# Patient Record
Sex: Female | Born: 1985 | Race: White | Hispanic: No | Marital: Single | Smoking: Former smoker
Health system: Southern US, Community
[De-identification: ages and names within clinical notes are randomized; demographics above are authoritative.]

## PROBLEM LIST (undated history)

## (undated) DIAGNOSIS — K08409 Partial loss of teeth, unspecified cause, unspecified class: Secondary | ICD-10-CM

## (undated) DIAGNOSIS — K8010 Calculus of gallbladder with chronic cholecystitis without obstruction: Secondary | ICD-10-CM

## (undated) DIAGNOSIS — F419 Anxiety disorder, unspecified: Secondary | ICD-10-CM

## (undated) HISTORY — DX: Partial loss of teeth, unspecified cause, unspecified class: K08.409

## (undated) HISTORY — DX: Calculus of gallbladder with chronic cholecystitis without obstruction: K80.10

---
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
## 2009-08-07 ENCOUNTER — Emergency Department: Payer: Self-pay | Admitting: Emergency Medicine

## 2012-07-15 DIAGNOSIS — K8010 Calculus of gallbladder with chronic cholecystitis without obstruction: Secondary | ICD-10-CM

## 2012-07-15 HISTORY — DX: Calculus of gallbladder with chronic cholecystitis without obstruction: K80.10

## 2012-09-02 ENCOUNTER — Ambulatory Visit: Payer: Self-pay | Admitting: Family Medicine

## 2012-09-13 ENCOUNTER — Emergency Department: Payer: Self-pay | Admitting: Emergency Medicine

## 2012-09-30 ENCOUNTER — Ambulatory Visit: Payer: Self-pay | Admitting: General Surgery

## 2012-09-30 DIAGNOSIS — K8010 Calculus of gallbladder with chronic cholecystitis without obstruction: Secondary | ICD-10-CM

## 2012-09-30 HISTORY — PX: CHOLECYSTECTOMY: SHX55

## 2012-09-30 LAB — CBC WITH DIFFERENTIAL/PLATELET
Basophil #: 0.1 10*3/uL (ref 0.0–0.1)
HGB: 14.8 g/dL (ref 12.0–16.0)
Lymphocyte %: 24.4 %
Monocyte #: 0.6 x10 3/mm (ref 0.2–0.9)
Monocyte %: 5.9 %
Neutrophil #: 6.9 10*3/uL — ABNORMAL HIGH (ref 1.4–6.5)
Platelet: 308 10*3/uL (ref 150–440)
WBC: 10.5 10*3/uL (ref 3.6–11.0)

## 2012-10-02 ENCOUNTER — Emergency Department: Payer: Self-pay | Admitting: Emergency Medicine

## 2012-10-02 LAB — PATHOLOGY REPORT

## 2012-10-16 ENCOUNTER — Encounter: Payer: Self-pay | Admitting: *Deleted

## 2012-10-21 ENCOUNTER — Encounter: Payer: Self-pay | Admitting: General Surgery

## 2012-10-21 ENCOUNTER — Encounter: Payer: BC Managed Care – PPO | Admitting: General Surgery

## 2012-10-21 ENCOUNTER — Ambulatory Visit (INDEPENDENT_AMBULATORY_CARE_PROVIDER_SITE_OTHER): Payer: BC Managed Care – PPO | Admitting: General Surgery

## 2012-10-21 VITALS — BP 120/80 | HR 80 | Resp 14 | Ht 67.0 in | Wt 225.0 lb

## 2012-10-21 DIAGNOSIS — K811 Chronic cholecystitis: Secondary | ICD-10-CM

## 2012-10-21 NOTE — Patient Instructions (Signed)
Call for concerns or questions 

## 2012-10-21 NOTE — Progress Notes (Signed)
Patient ID: Brenda Fowler, female   DOB: 1985/08/10, 27 y.o.   MRN: [id number]  Chief Complaint  Patient presents with  . Follow-up    postop    HPI Brenda Fowler is a 27 y.o. female.  Patient here today postoperative laparoscopy cholecystectomy on 09-30-12.  Requires no pain medications and has returned to work. HPI  Past Medical History  Diagnosis Date  . Calculus of gallbladder with other cholecystitis, without mention of obstruction 2014  . History of wisdom tooth extraction     Past Surgical History  Procedure Laterality Date  . Cholecystectomy  09/30/2012    No family history on file.  Social History History  Substance Use Topics  . Smoking status: Former Smoker    Quit date: 07/15/2010  . Smokeless tobacco: Never Used  . Alcohol Use: Yes    Allergies  Allergen Reactions  . Ceclor (Cefaclor) Rash    Current Outpatient Prescriptions  Medication Sig Dispense Refill  . etonogestrel (IMPLANON) 68 MG IMPL implant Inject 1 each into the skin once.       No current facility-administered medications for this visit.    Review of Systems Review of Systems  Constitutional: Negative.   Respiratory: Negative.   Cardiovascular: Negative.     Blood pressure 120/80, pulse 80, resp. rate 14, height 5\' 7"  (1.702 m), weight 225 lb (102.059 kg).  Physical Exam Physical Exam  Constitutional: She is oriented to person, place, and time. She appears well-developed and well-nourished.  Cardiovascular: Normal rate and regular rhythm.   Pulmonary/Chest: Effort normal and breath sounds normal.  Abdominal: Soft. Bowel sounds are normal.  Neurological: She is alert and oriented to person, place, and time.  Skin: Skin is warm and dry.  Lap sites healed  Data Reviewed Pathology showed chronic cholecystitis and cholelithiasis. No evidence of malignancy or dysplasia Assessment    The patient is doing well post cholecystectomy with complete relief of her symptoms    Plan    Followup  here will be on an as-needed basis.       Brenda Fowler 10/22/2012, 8:50 PM

## 2013-03-05 ENCOUNTER — Telehealth: Payer: Self-pay | Admitting: General Surgery

## 2013-03-05 DIAGNOSIS — K625 Hemorrhage of anus and rectum: Secondary | ICD-10-CM

## 2013-03-05 MED ORDER — HYDROCORTISONE ACETATE 25 MG RE SUPP: 25.0000 mg | Freq: Two times a day (BID) | RECTAL | Status: DC

## 2013-03-05 NOTE — Telephone Encounter (Signed)
Patient called reporting single episode of painless BRBPR w/ BM this afternoon. Reports 4-5 BM/ day since lap chole in March 2014 (previously 1 BM q 2-3 d). Usually, experiences abdominal discomfort prior to [facility] w/ complete relief after BM. No anal pain reported. Otherwise, feeling well. Suspect anorectal source, and as she reports no pain, likely internal hemorrhoids. To use Anusol HC supp BID over the weekend, and to call on Monday, August 25 to arrange f/u appt to assess options for management of loose stools.

## 2014-05-16 ENCOUNTER — Encounter: Payer: Self-pay | Admitting: General Surgery

## 2014-10-12 IMAGING — US US ABDOMEN LIMITED SLG ORGAN/ASCITES
1 series · 14 of 25 positions shown · non-contrast
Comparison: none

REASON FOR EXAM: Abd Pain Eval Gallbladder
COMMENTS:

PROCEDURE:     LSH - LSH ABDOMEN LTD 1 ORGAN OR QUAD  - September 02, 2012 [DATE]
RESULT:     Comparison: None
TECHNIQUE: Multiple gray-scale and color-flow Doppler images of the right
upper quadrant are presented for review.

[Series 1: us abdomen limited slg organ/ascites · 0.31mm/px · 14 of 46 slices shown]
[im 1/46]
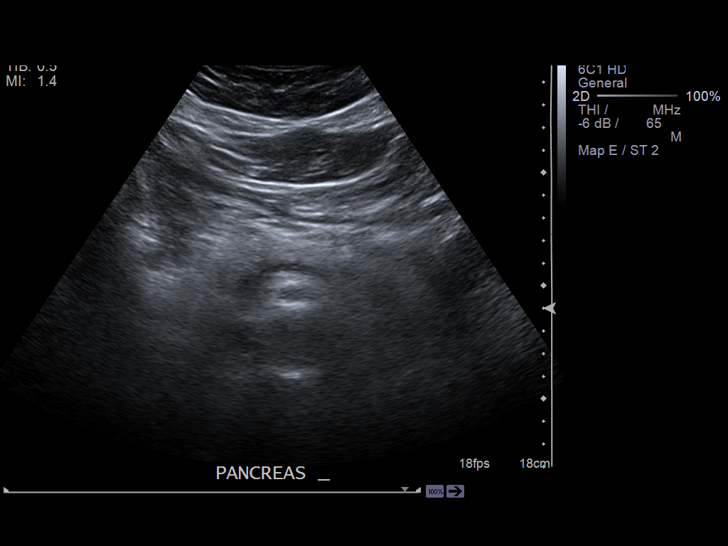
[im 4/46]
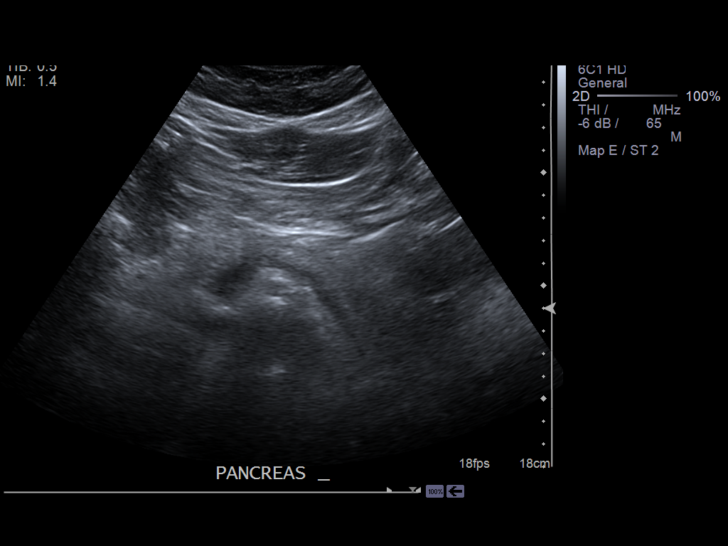
[im 8/46]
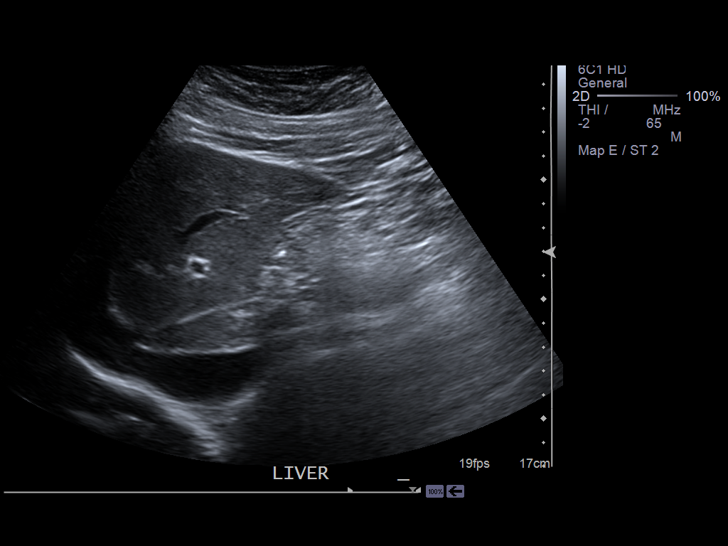
[im 12/46]
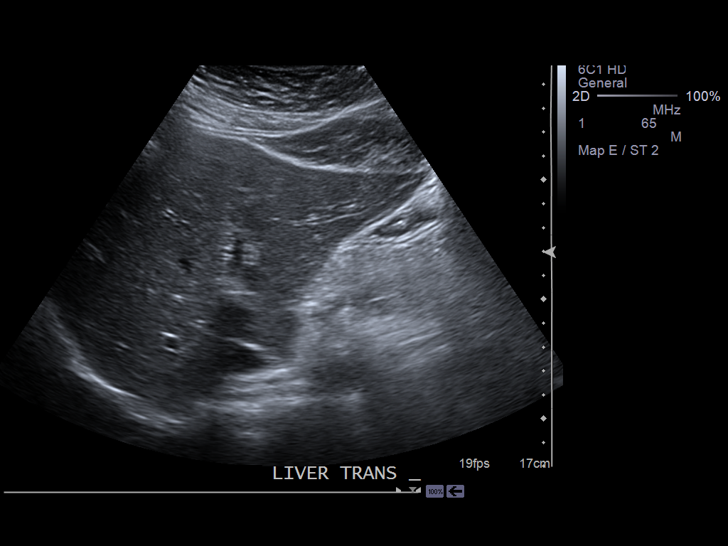
[im 16/46]
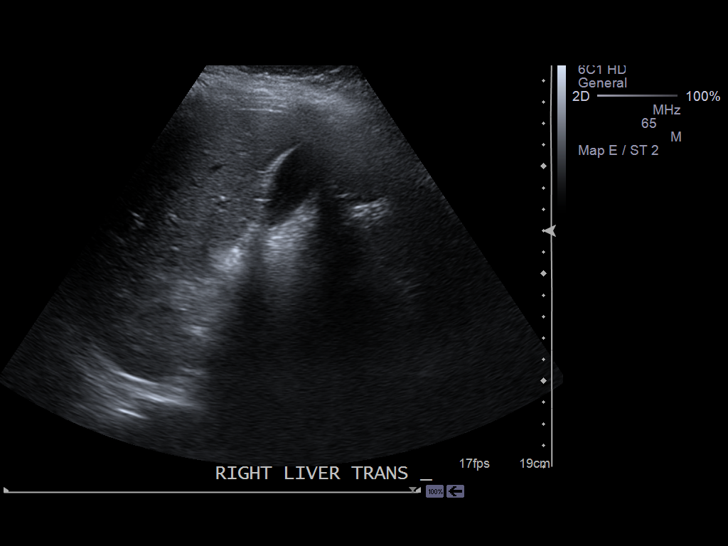
[im 17/46]
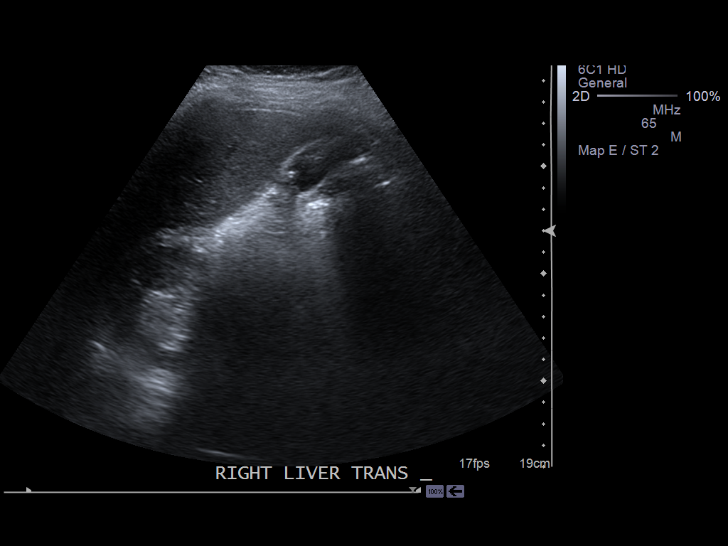
[im 21/46]
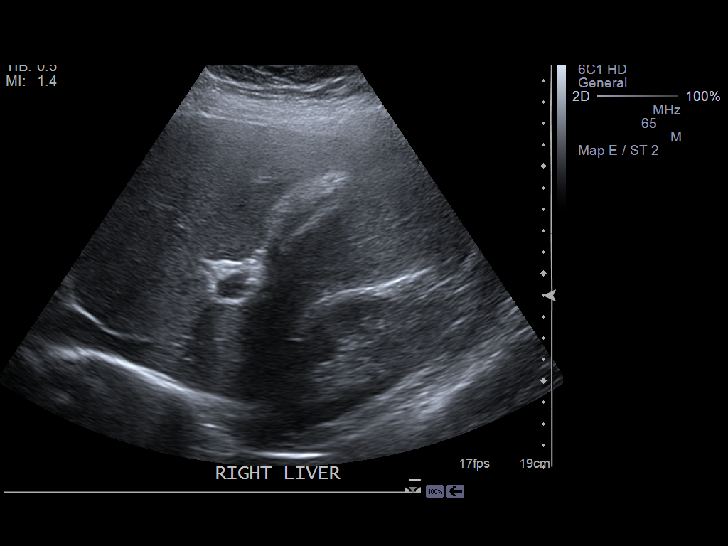
[im 25/46]
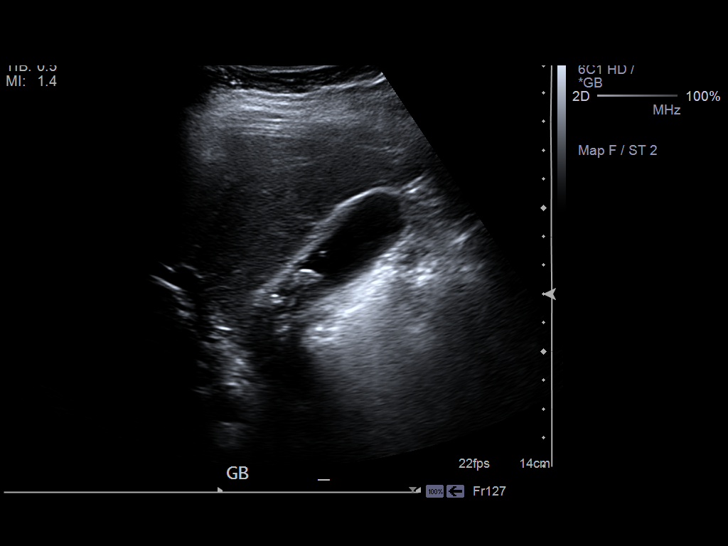
[im 29/46]
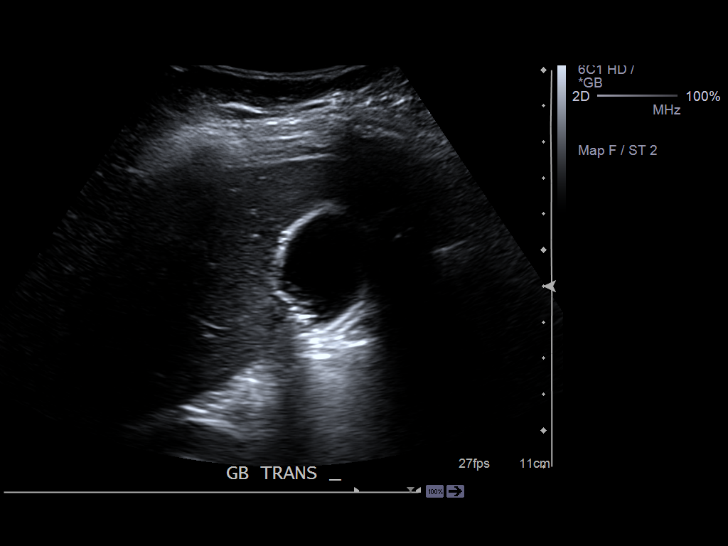
[im 31/46]
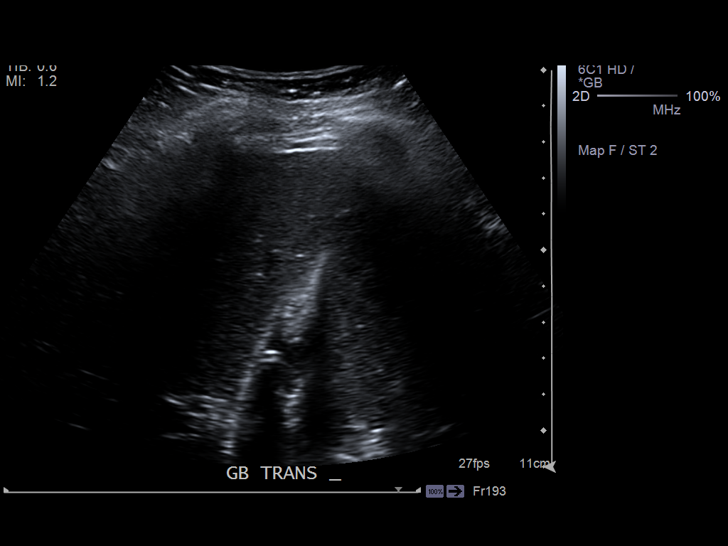
[im 34/46]
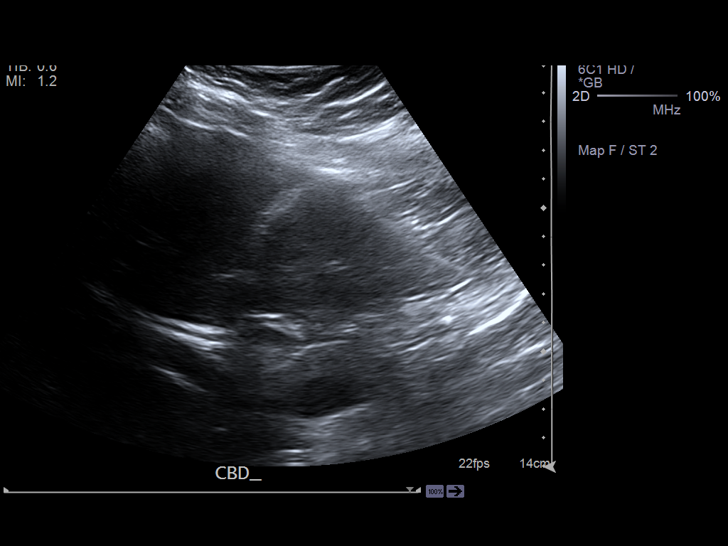
[im 38/46]
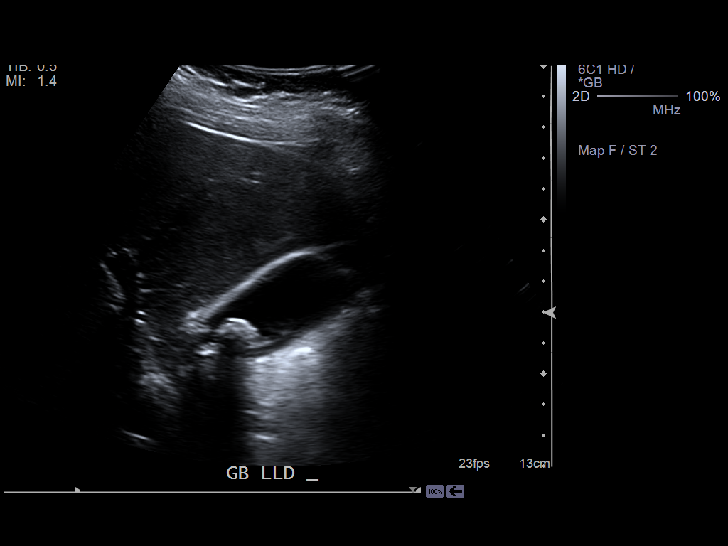
[im 42/46]
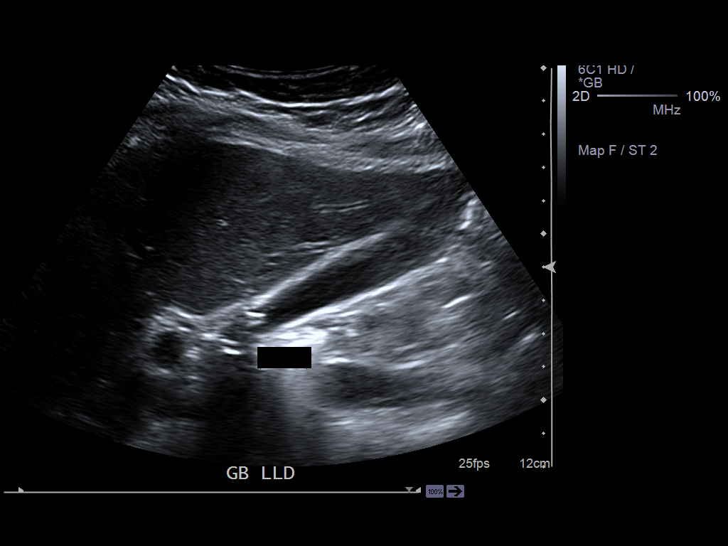
[im 46/46]
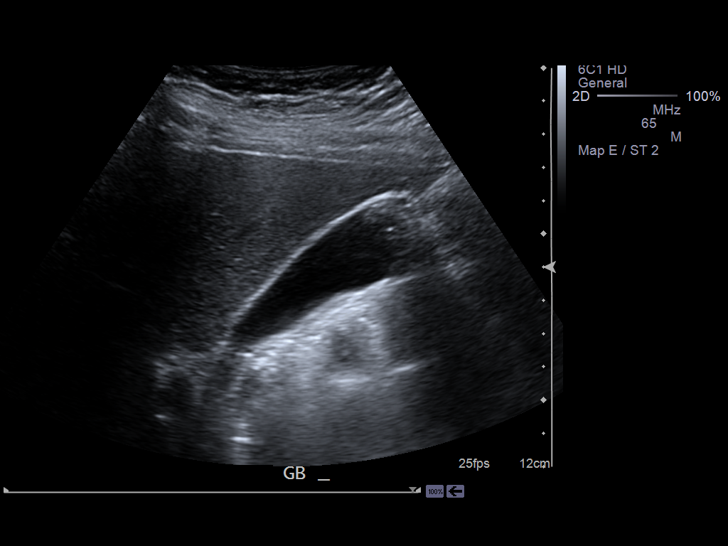

[14 of 25 positions shown; findings below may reference images not displayed]

FINDINGS: Visualized portions of the liver demonstrate normal echogenicity and normal
contours. The liver is without evidence of a focal hepatic lesion.

There are cholelithiasis. There is no intra- or extrahepatic biliary ductal
dilatation. The common duct measures 2.7 mm in maximal diameter. There is no
gallbladder wall thickening, pericholecystic fluid, or sonographic Murphy's
sign.

The visualized portion of the pancreas is normal in echogenicity.
IMPRESSION: Cholelithiasis without sonographic evidence of acute cholecystitis.

[REDACTED]

## 2014-11-04 NOTE — Op Note (Signed)
PATIENT NAMJuanita Fowler:  Turnbo, [name] MR#:  [id number] DATE OF BIRTH:  May 25, 1986  DATE OF PROCEDURE:  09/30/2012  PREOPERATIVE DIAGNOSIS: Chronic cholecystitis and cholelithiasis.   POSTOPERATIVE DIAGNOSIS: Chronic cholecystitis and cholelithiasis.   OPERATIVE PROCEDURE: Laparoscopic cholecystectomy with intraoperative cholangiograms.   SURGEON: Donnalee CurryJeffrey Nuel Dejaynes, MD   ANESTHESIA: General endotracheal under Dr. Ronalee BeltsBhandari.   ESTIMATED BLOOD LOSS: Less than 5 mL.   CLINICAL NOTE: This 29 year old woman has had episodic right upper quadrant pain and ultrasound showed evidence of cholelithiasis. She was a candidate for elective cholecystectomy.   DESCRIPTION OF PROCEDURE: With the patient under adequate general endotracheal anesthesia, the abdomen was prepped with ChloraPrep and draped. In Trendelenburg position, a Veress needle was placed through a transumbilical incision. After assuring intra-abdominal location with the hanging drop test, the abdomen was insufflated with CO2 at 10 mmHg pressure. A 10 mm step port was expanded. Inspection showed no evidence of injury from port placement. The patient was placed in reverse Trendelenburg position and rolled to the left. An 11 mm Xcel port was placed in the epigastrium and two 5-mm step ports placed laterally. The gallbladder was placed on cephalad traction. The left lobe of the liver was somewhat cumbersome and covering the neck of the gallbladder. The neck of the gallbladder and cystic duct were cleared. A Kumar clamp was placed and fluoroscopic cholangiograms completed using 5 mL of 1/2 strength Conray 60. This showed prompt filling of the right and left hepatic ducts and free flow into the duodenum. No evidence of retained stones. The cystic duct and multiple branches of the cystic artery adjacent to the gallbladder were doubly clipped and divided. The gallbladder was removed from the liver bed making use of hook cautery dissection. It was then delivered through the  umbilical port site. Inspection from the epigastric site showed no evidence of injury from initial port placement. The abdomen was re-insufflated and the right upper quadrant irrigated with lactated Ringer's solution. Excellent hemostasis was noted. The abdomen was then desufflated and ports removed under direct vision. Skin incisions were closed with 4-0 Vicryl subcuticular sutures. Benzoin, Steri-Strips, Telfa and Tegaderm dressing was then applied. The patient tolerated the procedure well and was taken to the recovery room in stable condition.      ____________________________ Earline MayotteJeffrey W. Mohamedamin Nifong, MD jwb:cc D: 09/30/2012 18:00:17 ET T: 09/30/2012 18:29:06 ET JOB#: [id number]  cc: Earline MayotteJeffrey W. Hara Milholland, MD, <Dictator> Cristal DeerErica R. Earlene PlaterWallace, DO Kennette Cuthrell Brion AlimentW Ottis Vacha MD ELECTRONICALLY SIGNED 10/01/2012 9:28

## 2014-11-09 IMAGING — XA DG CHOLANGIOGRAM OPERATIVE
1 series · 1 of 1 positions shown · non-contrast
Comparison: none

REASON FOR EXAM: Cholelithiasis
COMMENTS:

[Series 6001: b1 · 1 of 1 slices shown]
[im 1/1]
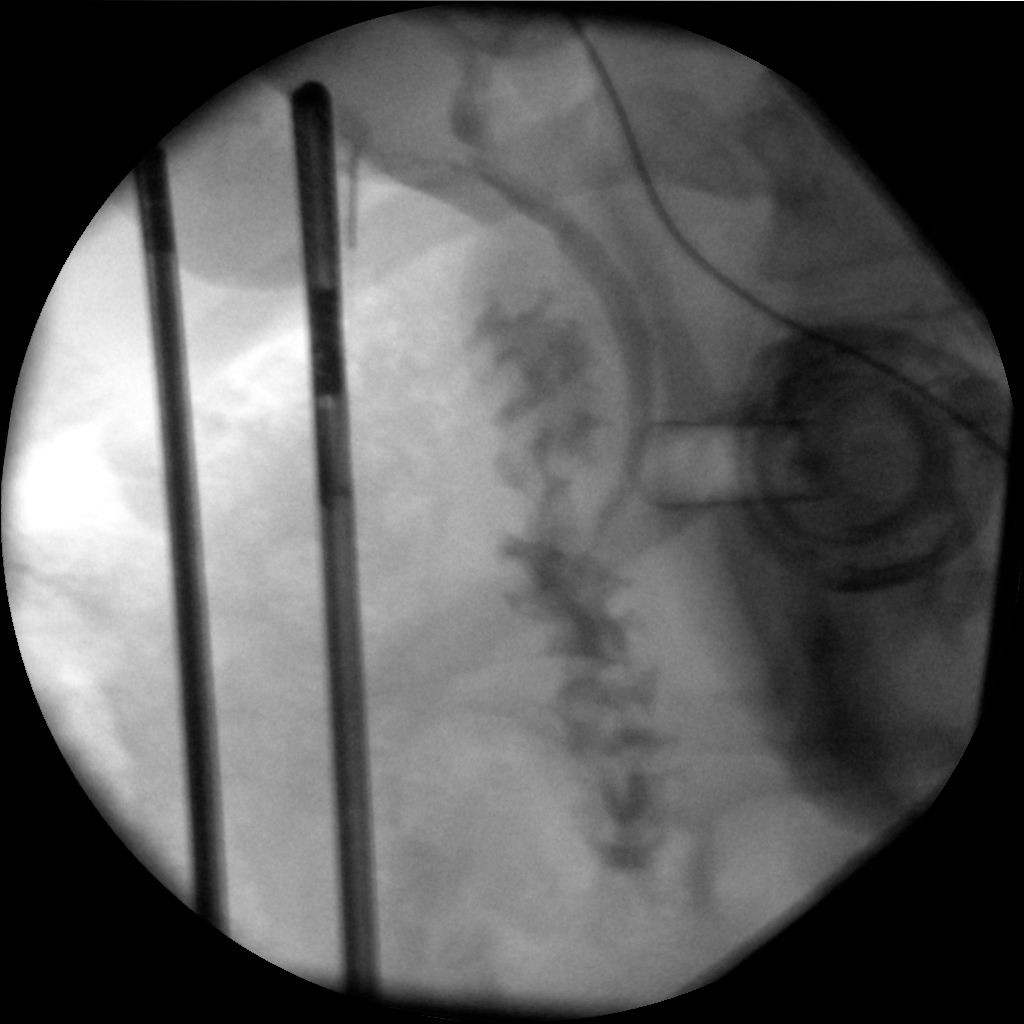

[1 of 1 positions shown; findings below may reference images not displayed]

PROCEDURE:     DXR - DXR CHOLANGIOGRAM OP (INITIAL)  - September 30, 2012  [DATE]

RESULT:     Findings: 1 intraoperative fluoroscopic spot images are obtained
of an intraoperative cholangiogram. by Dr. Dimitrov without a radiologist
present. Total fluoroscopy time 12 seconds.

There is opacification of the common bile duct without a filling defect.
There is contrast seen within the duodenum.
IMPRESSION: Please see above.

[REDACTED]

## 2015-02-15 DIAGNOSIS — O99213 Obesity complicating pregnancy, third trimester: Secondary | ICD-10-CM | POA: Insufficient documentation

## 2015-04-12 DIAGNOSIS — Z6791 Unspecified blood type, Rh negative: Secondary | ICD-10-CM | POA: Insufficient documentation

## 2015-05-02 ENCOUNTER — Emergency Department
Admission: EM | Admit: 2015-05-02 | Discharge: 2015-05-02 | Disposition: A | Discharge status: Home / Self Care | Payer: BLUE CROSS/BLUE SHIELD | Attending: Emergency Medicine | Admitting: Emergency Medicine

## 2015-05-02 ENCOUNTER — Encounter: Payer: Self-pay | Admitting: Emergency Medicine

## 2015-05-02 DIAGNOSIS — Z87891 Personal history of nicotine dependence: Secondary | ICD-10-CM | POA: Insufficient documentation

## 2015-05-02 DIAGNOSIS — Z7952 Long term (current) use of systemic steroids: Secondary | ICD-10-CM | POA: Insufficient documentation

## 2015-05-02 DIAGNOSIS — Z3A10 10 weeks gestation of pregnancy: Secondary | ICD-10-CM | POA: Diagnosis not present

## 2015-05-02 DIAGNOSIS — O210 Mild hyperemesis gravidarum: Secondary | ICD-10-CM | POA: Diagnosis present

## 2015-05-02 LAB — URINALYSIS COMPLETE WITH MICROSCOPIC (ARMC ONLY)
BILIRUBIN URINE: NEGATIVE
GLUCOSE, UA: NEGATIVE mg/dL
NITRITE: NEGATIVE
Protein, ur: NEGATIVE mg/dL
SPECIFIC GRAVITY, URINE: 1.026 (ref 1.005–1.030)
pH: 6 (ref 5.0–8.0)

## 2015-05-02 LAB — CBC WITH DIFFERENTIAL/PLATELET
BASOS ABS: 0.1 10*3/uL (ref 0–0.1)
Basophils Relative: 1 %
Eosinophils Absolute: 0.2 10*3/uL (ref 0–0.7)
Eosinophils Relative: 2 %
HEMATOCRIT: 39 % (ref 35.0–47.0)
Hemoglobin: 12.9 g/dL (ref 12.0–16.0)
LYMPHS PCT: 19 %
Lymphs Abs: 2 10*3/uL (ref 1.0–3.6)
MCH: 28.8 pg (ref 26.0–34.0)
MCHC: 33.1 g/dL (ref 32.0–36.0)
MCV: 87.1 fL (ref 80.0–100.0)
MONO ABS: 0.7 10*3/uL (ref 0.2–0.9)
Monocytes Relative: 7 %
NEUTROS ABS: 7.8 10*3/uL — AB (ref 1.4–6.5)
Neutrophils Relative %: 71 %
PLATELETS: 261 10*3/uL (ref 150–440)
RBC: 4.48 MIL/uL (ref 3.80–5.20)
RDW: 14 % (ref 11.5–14.5)
WBC: 10.8 10*3/uL (ref 3.6–11.0)

## 2015-05-02 LAB — BASIC METABOLIC PANEL
ANION GAP: 6 (ref 5–15)
BUN: 10 mg/dL (ref 6–20)
CHLORIDE: 106 mmol/L (ref 101–111)
CO2: 23 mmol/L (ref 22–32)
Calcium: 9 mg/dL (ref 8.9–10.3)
Creatinine, Ser: 0.53 mg/dL (ref 0.44–1.00)
GFR calc Af Amer: >60 mL/min (ref >60)
GLUCOSE: 82 mg/dL (ref 65–99)
POTASSIUM: 3.6 mmol/L (ref 3.5–5.1)
Sodium: 135 mmol/L (ref 135–145)

## 2015-05-02 MED ORDER — SODIUM CHLORIDE 0.9 % IV BOLUS (SEPSIS)
1000.0000 mL | Freq: Once | INTRAVENOUS | Status: AC
  Administered 2015-05-02: 1000 mL via INTRAVENOUS

## 2015-05-02 MED ORDER — PROMETHAZINE HCL 12.5 MG RE SUPP: 12.5000 mg | Freq: Four times a day (QID) | RECTAL | Status: DC | PRN

## 2015-05-02 MED ORDER — ONDANSETRON HCL 4 MG PO TABS: 4.0000 mg | ORAL_TABLET | Freq: Three times a day (TID) | ORAL | Status: DC | PRN

## 2015-05-02 MED ORDER — ONDANSETRON HCL 4 MG/2ML IJ SOLN
4.0000 mg | Freq: Once | INTRAMUSCULAR | Status: AC
  Administered 2015-05-02: 4 mg via INTRAVENOUS
  Filled 2015-05-02: qty 2

## 2015-05-02 NOTE — ED Notes (Signed)
Pt ambulates to bathroom

## 2015-05-02 NOTE — Discharge Instructions (Signed)
Hyperemesis Gravidarum Hyperemesis gravidarum is a severe form of nausea and vomiting that happens during pregnancy. Hyperemesis is worse than morning sickness. It may cause you to have nausea or vomiting all day for many days. It may keep you from eating and drinking enough food and liquids. Hyperemesis usually occurs during the first half (the first 20 weeks) of pregnancy. It often goes away once a woman is in her second half of pregnancy. However, sometimes hyperemesis continues through an entire pregnancy.  CAUSES  The cause of this condition is not completely known but is thought to be related to changes in the body's hormones when pregnant. It could be from the high level of the pregnancy hormone or an increase in estrogen in the body.  SIGNS AND SYMPTOMS   Severe nausea and vomiting.  Nausea that does not go away.  Vomiting that does not allow you to keep any food down.  Weight loss and body fluid loss (dehydration).  Having no desire to eat or not liking food you have previously enjoyed. DIAGNOSIS  Your health care provider will do a physical exam and ask you about your symptoms. He or she may also order blood tests and urine tests to make sure something else is not causing the problem.  TREATMENT  You may only need medicine to control the problem. If medicines do not control the nausea and vomiting, you will be treated in the hospital to prevent dehydration, increased acid in the blood (acidosis), weight loss, and changes in the electrolytes in your body that may harm the unborn baby (fetus). You may need IV fluids.  HOME CARE INSTRUCTIONS   Only take over-the-counter or prescription medicines as directed by your health care provider.  Try eating a couple of dry crackers or toast in the morning before getting out of bed.  Avoid foods and smells that upset your stomach.  Avoid fatty and spicy foods.  Eat 5-6 small meals a day.  Do not drink when eating meals. Drink between  meals.  For snacks, eat high-protein foods, such as cheese.  Eat or suck on things that have ginger in them. Ginger helps nausea.  Avoid food preparation. The smell of food can spoil your appetite.  Avoid iron pills and iron in your multivitamins until after 3-4 months of being pregnant. However, consult with your health care provider before stopping any prescribed iron pills. SEEK MEDICAL CARE IF:   Your abdominal pain increases.  You have a severe headache.  You have vision problems.  You are losing weight. SEEK IMMEDIATE MEDICAL CARE IF:   You are unable to keep fluids down.  You vomit blood.  You have constant nausea and vomiting.  You have excessive weakness.  You have extreme thirst.  You have dizziness or fainting.  You have a fever or persistent symptoms for more than 2-3 days.  You have a fever and your symptoms suddenly get worse. MAKE SURE YOU:   Understand these instructions.  Will watch your condition.  Will get help right away if you are not doing well or get worse.   This information is not intended to replace advice given to you by your health care provider. Make sure you discuss any questions you have with your health care provider.   Document Released: 07/01/2005 Document Revised: 04/21/2013 Document Reviewed: 02/10/2013 Elsevier Interactive Patient Education [employer]2016 [employer].  Please return immediately if condition worsens. Please contact her primary physician or the physician you were given for referral. If you  have any specialist physicians involved in her treatment and plan please also contact them. Thank you for using [facility]. ° °

## 2015-05-02 NOTE — ED Notes (Signed)
Pt brought in via triage; pt in with complaints of nausea throughout entire pregnancy but vomiting x2 day. Vital signs WDL; no signs of immediate distress at this time.

## 2015-05-02 NOTE — ED Notes (Signed)
Pt to ed with c/o vomiting x 2 days.  Pt is approx [redacted] weeks pregnant and has had n/v x several weeks, worse over the last two days.  Pt reports feeling weak now.

## 2015-05-02 NOTE — ED Provider Notes (Signed)
Time Seen: Approximately 1300 I have reviewed the triage notes  Chief Complaint: Emesis   History of Present Illness: Brenda Fowler is a 29 y.o. female who presents with some persistent nausea vomiting. Patient states she vomited 5 times today with no blood or bile. Patient is [redacted] weeks pregnant and has had previous ultrasound for vaginal spotting which showed it was in the normal position. She is gravida 1 para 0. Patient denies any vaginal bleeding or discharge on today's visit. She denies any loose stool or diarrhea and has tried Phenergan at home without success for relief of her nausea and vomiting. He denies any fever or dysuria or hematuria.   Past Medical History  Diagnosis Date  . Calculus of gallbladder with other cholecystitis, without mention of obstruction 2014  . History of wisdom tooth extraction     There are no active problems to display for this patient.   Past Surgical History  Procedure Laterality Date  . Cholecystectomy  09/30/2012    Past Surgical History  Procedure Laterality Date  . Cholecystectomy  09/30/2012    Current Outpatient Rx  Name  Route  Sig  Dispense  Refill  . etonogestrel (IMPLANON) 68 MG IMPL implant   Subcutaneous   Inject 1 each into the skin once.         . hydrocortisone (ANUSOL-HC) 25 MG suppository   Rectal   Place 1 suppository (25 mg total) rectally 2 (two) times daily.   12 suppository   1   . ondansetron (ZOFRAN) 4 MG tablet   Oral   Take 1 tablet (4 mg total) by mouth every 8 (eight) hours as needed for nausea or vomiting.   21 tablet   0   . promethazine (PHENERGAN) 12.5 MG suppository   Rectal   Place 1 suppository (12.5 mg total) rectally every 6 (six) hours as needed for nausea or vomiting.   12 each   0     Allergies:  Ceclor  Family History: History reviewed. No pertinent family history.  Social History: Social History  Substance Use Topics  . Smoking status: Former Smoker    Quit date: 07/15/2010   . Smokeless tobacco: Never Used  . Alcohol Use: No     Review of Systems:   10 point review of systems was performed and was otherwise negative:  Constitutional: No fever Eyes: No visual disturbances ENT: No sore throat, ear pain Cardiac: No chest pain Respiratory: No shortness of breath, wheezing, or stridor Abdomen: No abdominal pain, no vomiting, No diarrhea Endocrine: No weight loss, No night sweats Extremities: No peripheral edema, cyanosis Skin: No rashes, easy bruising Neurologic: No focal weakness, trouble with speech or swollowing Urologic: No dysuria, Hematuria, or urinary frequency   Physical Exam:  ED Triage Vitals  Enc Vitals Group     BP 05/02/15 1204 127/87 mmHg     Pulse Rate 05/02/15 1204 93     Resp 05/02/15 1204 18     Temp 05/02/15 1204 98.6 F (37 C)     Temp Source 05/02/15 1204 Oral     SpO2 05/02/15 1204 99 %     Weight 05/02/15 1153 252 lb (114.306 kg)     Height 05/02/15 1153  (1.676 m)     Head Cir --      Peak Flow --      Pain Score 05/02/15 1154 4     Pain Loc --      Pain Edu? --  Excl. in GC? --     General: Awake , Alert , and Oriented times 3; GCS 15 Head: Normal cephalic , atraumatic Eyes: Pupils equal , round, reactive to light Nose/Throat: No nasal drainage, patent upper airway without erythema or exudate.  Neck: Supple, Full range of motion, No anterior adenopathy or palpable thyroid masses Lungs: Clear to ascultation without wheezes , rhonchi, or rales Heart: Regular rate, regular rhythm without murmurs , gallops , or rubs Abdomen: Soft, non tender without rebound, guarding , or rigidity; bowel sounds positive and symmetric in all 4 quadrants. No organomegaly .        Extremities: 2 plus symmetric pulses. No edema, clubbing or cyanosis Neurologic: normal ambulation, Motor symmetric without deficits, sensory intact Skin: warm, dry, no rashes   Labs:   All laboratory work was reviewed including any pertinent  negatives or positives listed below:  Labs Reviewed  CBC WITH DIFFERENTIAL/PLATELET - Abnormal; Notable for the following:    Neutro Abs 7.8 (*)    All other components within normal limits  URINALYSIS COMPLETEWITH MICROSCOPIC ([facility] ONLY) - Abnormal; Notable for the following:    Color, Urine YELLOW (*)    APPearance HAZY (*)    Ketones, ur 2+ (*)    Hgb urine dipstick 2+ (*)    Leukocytes, UA TRACE (*)    Bacteria, UA RARE (*)    Squamous Epithelial / LPF 6-30 (*)    All other components within normal limits  BASIC METABOLIC PANEL        Procedures: Patient had a bedside ultrasound suprapubic by myself which showed a fetal heartbeat appears normal. Intrauterine pregnancy       ED Course:  Patient received IV fluid bolus and IV Zofran here in emergency department with resolution of her nausea and vomiting. She states that she does have a headache which seemed to be constitutional in nature. I don't suspect a life-threatening headache such as intracerebral hemorrhage or meningitis, etc. I felt was unlikely to be a cavernous venous thrombosis disease and most likely is constitutional. Likely due to some mild dehydration.  Assessment:  Hyperemesis gravidarum Constitutional headache     Plan: Patient was advised to return immediately if condition worsens. Patient was advised to follow up with her primary care physician or other specialized physicians involved and in their current assessment.            Jennye MoccasinBrian S Quigley, MD 05/02/15 [phone]

## 2015-05-12 LAB — OB RESULTS CONSOLE VARICELLA ZOSTER ANTIBODY, IGG: VARICELLA IGG: IMMUNE

## 2015-05-12 LAB — OB RESULTS CONSOLE HIV ANTIBODY (ROUTINE TESTING): HIV: NONREACTIVE

## 2015-05-12 LAB — OB RESULTS CONSOLE HEPATITIS B SURFACE ANTIGEN: Hepatitis B Surface Ag: NEGATIVE

## 2015-05-12 LAB — OB RESULTS CONSOLE RUBELLA ANTIBODY, IGM: RUBELLA: IMMUNE

## 2015-05-13 DIAGNOSIS — Z3400 Encounter for supervision of normal first pregnancy, unspecified trimester: Secondary | ICD-10-CM | POA: Insufficient documentation

## 2015-05-16 ENCOUNTER — Other Ambulatory Visit: Payer: Self-pay | Admitting: Obstetrics and Gynecology

## 2015-05-16 DIAGNOSIS — Z369 Encounter for antenatal screening, unspecified: Secondary | ICD-10-CM

## 2015-10-30 DIAGNOSIS — O133 Gestational [pregnancy-induced] hypertension without significant proteinuria, third trimester: Secondary | ICD-10-CM | POA: Insufficient documentation

## 2015-10-30 LAB — OB RESULTS CONSOLE GC/CHLAMYDIA
Chlamydia: NEGATIVE
Gonorrhea: NEGATIVE

## 2015-10-30 LAB — OB RESULTS CONSOLE GBS: GBS: POSITIVE

## 2015-10-30 LAB — OB RESULTS CONSOLE RPR
RPR: NONREACTIVE
RPR: NONREACTIVE

## 2015-11-07 DIAGNOSIS — O99820 Streptococcus B carrier state complicating pregnancy: Secondary | ICD-10-CM | POA: Insufficient documentation

## 2015-11-10 ENCOUNTER — Encounter: Payer: Self-pay | Admitting: Obstetrics and Gynecology

## 2015-11-10 ENCOUNTER — Inpatient Hospital Stay
Admission: EM | Admit: 2015-11-10 | Discharge: 2015-11-16 | DRG: 765 | Disposition: A | Discharge status: Home / Self Care | Payer: BLUE CROSS/BLUE SHIELD | Attending: Obstetrics and Gynecology | Admitting: Obstetrics and Gynecology

## 2015-11-10 DIAGNOSIS — D649 Anemia, unspecified: Secondary | ICD-10-CM | POA: Diagnosis not present

## 2015-11-10 DIAGNOSIS — O2662 Liver and biliary tract disorders in childbirth: Secondary | ICD-10-CM | POA: Diagnosis present

## 2015-11-10 DIAGNOSIS — Z23 Encounter for immunization: Secondary | ICD-10-CM

## 2015-11-10 DIAGNOSIS — R0602 Shortness of breath: Secondary | ICD-10-CM | POA: Diagnosis not present

## 2015-11-10 DIAGNOSIS — O99824 Streptococcus B carrier state complicating childbirth: Secondary | ICD-10-CM | POA: Diagnosis present

## 2015-11-10 DIAGNOSIS — Z3A37 37 weeks gestation of pregnancy: Secondary | ICD-10-CM | POA: Diagnosis not present

## 2015-11-10 DIAGNOSIS — O8612 Endometritis following delivery: Secondary | ICD-10-CM

## 2015-11-10 DIAGNOSIS — O26613 Liver and biliary tract disorders in pregnancy, third trimester: Secondary | ICD-10-CM

## 2015-11-10 DIAGNOSIS — O4212 Full-term premature rupture of membranes, onset of labor more than 24 hours following rupture: Secondary | ICD-10-CM | POA: Diagnosis present

## 2015-11-10 DIAGNOSIS — O9081 Anemia of the puerperium: Secondary | ICD-10-CM | POA: Diagnosis not present

## 2015-11-10 DIAGNOSIS — O26893 Other specified pregnancy related conditions, third trimester: Secondary | ICD-10-CM | POA: Diagnosis present

## 2015-11-10 DIAGNOSIS — O26643 Intrahepatic cholestasis of pregnancy, third trimester: Secondary | ICD-10-CM | POA: Diagnosis present

## 2015-11-10 DIAGNOSIS — Z6791 Unspecified blood type, Rh negative: Secondary | ICD-10-CM

## 2015-11-10 DIAGNOSIS — Z880 Allergy status to penicillin: Secondary | ICD-10-CM | POA: Diagnosis not present

## 2015-11-10 DIAGNOSIS — K831 Obstruction of bile duct: Secondary | ICD-10-CM | POA: Diagnosis present

## 2015-11-10 DIAGNOSIS — R42 Dizziness and giddiness: Secondary | ICD-10-CM | POA: Diagnosis not present

## 2015-11-10 DIAGNOSIS — R0789 Other chest pain: Secondary | ICD-10-CM

## 2015-11-10 LAB — CBC
HCT: 28.3 % — ABNORMAL LOW (ref 35.0–47.0)
HEMOGLOBIN: 9.3 g/dL — AB (ref 12.0–16.0)
MCH: 26.7 pg (ref 26.0–34.0)
MCHC: 33 g/dL (ref 32.0–36.0)
MCV: 80.8 fL (ref 80.0–100.0)
Platelets: 280 10*3/uL (ref 150–440)
RBC: 3.5 MIL/uL — ABNORMAL LOW (ref 3.80–5.20)
RDW: 15 % — AB (ref 11.5–14.5)
WBC: 10.5 10*3/uL (ref 3.6–11.0)

## 2015-11-10 LAB — ABO/RH: ABO/RH(D): A NEG

## 2015-11-10 LAB — TYPE AND SCREEN
ABO/RH(D): A NEG
ANTIBODY SCREEN: NEGATIVE

## 2015-11-10 MED ORDER — CLINDAMYCIN PHOSPHATE 900 MG/50ML IV SOLN: 900.0000 mg | Freq: Three times a day (TID) | INTRAVENOUS | Status: DC

## 2015-11-10 MED ORDER — LACTATED RINGERS IV SOLN
INTRAVENOUS | Status: DC
  Administered 2015-11-10 – 2015-11-13 (×6): via INTRAVENOUS

## 2015-11-10 MED ORDER — DINOPROSTONE 10 MG VA INST
10.0000 mg | VAGINAL_INSERT | Freq: Once | VAGINAL | Status: AC
  Administered 2015-11-10: 10 mg via VAGINAL
  Filled 2015-11-10: qty 1

## 2015-11-10 MED ORDER — OXYTOCIN 40 UNITS IN LACTATED RINGERS INFUSION - SIMPLE MED
2.5000 [IU]/h | INTRAVENOUS | Status: DC
  Administered 2015-11-13: 2.5 [IU]/h via INTRAVENOUS
  Administered 2015-11-13: 1000 mL via INTRAVENOUS
  Filled 2015-11-10: qty 1000

## 2015-11-10 MED ORDER — OXYTOCIN BOLUS FROM INFUSION
500.0000 mL | INTRAVENOUS | Status: DC
Start: 2015-11-10 — End: 2015-11-13

## 2015-11-10 MED ORDER — LIDOCAINE HCL (PF) 1 % IJ SOLN: 30.0000 mL | INTRAMUSCULAR | Status: DC | PRN

## 2015-11-10 MED ORDER — LACTATED RINGERS IV SOLN: 500.0000 mL | INTRAVENOUS | Status: DC | PRN

## 2015-11-10 MED ORDER — CLINDAMYCIN PHOSPHATE 900 MG/50ML IV SOLN
900.0000 mg | Freq: Three times a day (TID) | INTRAVENOUS | Status: DC
  Administered 2015-11-10 – 2015-11-13 (×9): 900 mg via INTRAVENOUS
  Filled 2015-11-10 (×9): qty 50

## 2015-11-10 MED ORDER — CITRIC ACID-SODIUM CITRATE 334-500 MG/5ML PO SOLN
30.0000 mL | ORAL | Status: DC | PRN
  Administered 2015-11-12 – 2015-11-13 (×2): 30 mL via ORAL
  Filled 2015-11-10: qty 15
  Filled 2015-11-10: qty 30

## 2015-11-10 MED ORDER — BUTORPHANOL TARTRATE 1 MG/ML IJ SOLN
1.0000 mg | INTRAMUSCULAR | Status: DC | PRN
  Administered 2015-11-12 (×2): 1 mg via INTRAVENOUS
  Filled 2015-11-10 (×2): qty 1

## 2015-11-10 MED ORDER — ACETAMINOPHEN 325 MG PO TABS
650.0000 mg | ORAL_TABLET | ORAL | Status: DC | PRN
  Administered 2015-11-11: 650 mg via ORAL
  Filled 2015-11-10: qty 2

## 2015-11-10 MED ORDER — ONDANSETRON HCL 4 MG/2ML IJ SOLN
4.0000 mg | Freq: Four times a day (QID) | INTRAMUSCULAR | Status: DC | PRN
  Filled 2015-11-10: qty 2

## 2015-11-10 MED ORDER — TERBUTALINE SULFATE 1 MG/ML IJ SOLN: 0.2500 mg | Freq: Once | INTRAMUSCULAR | Status: DC | PRN

## 2015-11-10 MED ORDER — CALCIUM CARBONATE ANTACID 500 MG PO CHEW
1.0000 | CHEWABLE_TABLET | Freq: Three times a day (TID) | ORAL | Status: DC
  Administered 2015-11-10: 200 mg via ORAL
  Filled 2015-11-10: qty 1

## 2015-11-10 MED ORDER — HYDROXYZINE HCL 25 MG PO TABS
25.0000 mg | ORAL_TABLET | Freq: Three times a day (TID) | ORAL | Status: DC | PRN
  Administered 2015-11-10 – 2015-11-13 (×7): 25 mg via ORAL
  Filled 2015-11-10 (×9): qty 1

## 2015-11-10 NOTE — H&P (Signed)
  OB ADMISSION/ HISTORY & PHYSICAL:  Admission Date: 11/10/15 Admit Diagnosis: IOL at 37 weeks for Intrahepatic cholestasis of pregnancy  Brenda Fowler is a 30 y.o. female presenting for IOL at 3737 for Intrahepatic cholestasis of pregnancy.   Prenatal History: G1P0   EDC : 12/01/15, by LMP of 02/01/15 [facility] Prenatal course complicated by:    - Rh Negative: Received Rhogam on 04/12/15 for vaginal bleeding in first trimester and at 28 weeks   - Obesity: BMI 40   - Intrahepatic Cholestasis: Bile acids: 30.5 Diagnosed on 10/24/15: started on Actigall 300mg  TID, Vistaril 25mg  PRN  NSTs weekly  Strict fetal kick counts  - Gestational HTN: PIH labs done 10/30/15 and 4/25 WNL, Monitor home BPs twice daily   - Polyhydramnios:  last AFI 25 on 4/19  Prenatal Labs: ABO, Rh:  A Negative Antibody:  Negative Rubella:   Immune Varicella: Immune RPR:   NR HBsAg:   Negative HIV:   Negative GTT: 105 GBS:   POSITIVE - allergies to Amoxicillin   Medical / Surgical History :  Past medical history:  Past Medical History  Diagnosis Date  . Calculus of gallbladder with other cholecystitis, without mention of obstruction 2014  . History of wisdom tooth extraction      Past surgical history:  Past Surgical History  Procedure Laterality Date  . Cholecystectomy  09/30/2012    Family History: No family history on file.   Social History:  reports that she quit smoking about 5 years ago. She has never used smokeless tobacco. She reports that she does not drink alcohol or use illicit drugs.   Allergies: Ceclor  Amoxicillin - rash   Current Medications at time of admission:  Prior to Admission medications   Medication Sig Start Date End Date Taking? Authorizing Provider  etonogestrel (IMPLANON) 68 MG IMPL implant Inject 1 each into the skin once.    Historical Provider, MD  hydrocortisone (ANUSOL-HC) 25 MG suppository Place 1 suppository (25 mg total) rectally 2 (two) times  daily. 03/05/13   Earline MayotteJeffrey W Byrnett, MD  ondansetron (ZOFRAN) 4 MG tablet Take 1 tablet (4 mg total) by mouth every 8 (eight) hours as needed for nausea or vomiting. 05/02/15   Jennye MoccasinBrian S Quigley, MD  promethazine (PHENERGAN) 12.5 MG suppository Place 1 suppository (12.5 mg total) rectally every 6 (six) hours as needed for nausea or vomiting. 05/02/15   Jennye MoccasinBrian S Quigley, MD     Review of Systems: Active FM Irregular ctxs No LOF  / SROM  No bloody show    Physical Exam:  VS: There were no vitals taken for this visit.  General: alert and oriented, appears calm Heart: RRR Lungs: Clear lung fields Abdomen: Gravid, soft and non-tender, non-distended / uterus: gravid, nontender  Extremities: +1 edema  Genitalia / VE:    Last exam 10/30/15: SVE: 0.5cm/50%/-3/vtx  Assessment: [redacted] weeks gestation Induction stage of labor GBS Positive  Obesity Intrahepatic cholestasis  Polyhydramnios    Plan:  1. Induction of Labor   - Cervidil vaginally x 1  - Routine Labor and Delivery orders  - Stadol 1mg  IVP every hour PRN for pain  - May have epidural upon request  - Continuous fetal monitoring  2. GBS Positive - Amoxicillin allergy -rash  - Clindamycin susceptible  3. EFW on 10/20/15: 3026 grams (6#11oz) 4. Contraception:   - IUD vs. Nexplanon  Dr. Feliberto GottronSchermerhorn notified of admission / plan of care  Brenda Fowler, CNM

## 2015-11-10 NOTE — Progress Notes (Signed)
H&P Update  Pt was last seen on 11/10/15  and complete history and physical performed.  The surgical history has been reviewed and remains accurate without interval change. The patient was re-examined and patient's physiologic condition has not changed significantly in the last 30 days.  No new pharmacological allergies or types of therapy has been initiated.  Allergies  Allergen Reactions  . Amoxicillin Rash  . Ceclor [Cefaclor] Rash    Past Medical History  Diagnosis Date  . Calculus of gallbladder with other cholecystitis, without mention of obstruction 2014  . History of wisdom tooth extraction    Past Surgical History  Procedure Laterality Date  . Cholecystectomy  09/30/2012    BP 133/75 mmHg  Temp(Src) 98 F (36.7 C) (Oral)  Resp 19  Ht 5\' 7"  (1.702 m)  Wt 260 lb (117.935 kg)  BMI 40.71 kg/m2  SpO2 98%  NAD RRR no murmurs CTAB, no wheezing, resps unlabored +BS, soft, NTTP Cx: 1.5/80/vtx-1 Gravid, EFW: 8#6oz  The above history was confirmed with the patient. The condition still exists that makeIOL  necessary.  Inudction plan includes  Cervidil  confirmed on the consent. The treatment plan remains the same, without new options for care.  The patient understands the potential benefits and risks and the consents have been signed and placed on the chart.   A: IUP at 37 weeks 2. Cholestasis P: 1. Cervidil for IOL due to cholestasis. 2. Deliver at 37 weeks due to increased risks of stillborn. 3. External and maternal uterine toco and fetal monitors.  Milon Scorearon Jones, MSN, CNM, FNP

## 2015-11-11 MED ORDER — URSODIOL 300 MG PO CAPS
300.0000 mg | ORAL_CAPSULE | Freq: Three times a day (TID) | ORAL | Status: DC
  Administered 2015-11-11 – 2015-11-13 (×4): 300 mg via ORAL
  Filled 2015-11-11 (×10): qty 1

## 2015-11-11 MED ORDER — ZOLPIDEM TARTRATE 5 MG PO TABS
5.0000 mg | ORAL_TABLET | Freq: Every evening | ORAL | Status: DC | PRN
  Administered 2015-11-12 (×2): 5 mg via ORAL
  Filled 2015-11-11 (×2): qty 1

## 2015-11-11 MED ORDER — DINOPROSTONE 10 MG VA INST
10.0000 mg | VAGINAL_INSERT | Freq: Once | VAGINAL | Status: AC
  Administered 2015-11-11: 10 mg via VAGINAL
  Filled 2015-11-11: qty 1

## 2015-11-11 MED ORDER — DINOPROSTONE 10 MG VA INST
10.0000 mg | VAGINAL_INSERT | Freq: Once | VAGINAL | Status: AC
  Administered 2015-11-12: 10 mg via VAGINAL
  Filled 2015-11-11: qty 1

## 2015-11-11 NOTE — Progress Notes (Signed)
Brenda Fowler is a 30 y.o. G1P0 at 843w1d by ultrasound admitted for cholestasis of pregnancy. DAy 2 induction . Cervidil last pm .   Subjective: Ctx mild . Tough to get comfortable   Objective: BP 136/85 mmHg  Pulse 81  Temp(Src) 98 F (36.7 C) (Oral)  Resp 14  Ht 5\' 8"  (1.727 m)  Wt 261 lb (118.389 kg)  BMI 39.69 kg/m2  SpO2 98% I/O last 3 completed shifts: In: 295 [P.O.:120; I.V.:125; IV Piggyback:50] Out: -     FHT:  130 + accels . No decels except mild variable  UC:   irregular, every 5 minutes SVE:   Dilation: 1.5 Effacement (%): 80 Station: -2 Exam by:: Dr. Feliberto GottronSchermerhorn  Labs: Lab Results  Component Value Date   WBC 10.5 11/10/2015   HGB 9.3* 11/10/2015   HCT 28.3* 11/10/2015   MCV 80.8 11/10/2015   PLT 280 11/10/2015    Assessment / Plan: Day 2 induction . Reassuring fetal monitoring Pt can clean up and eat small meal and I will restart cervidil after  SCHERMERHORN,THOMAS 11/11/2015, 9:37 AM

## 2015-11-11 NOTE — Progress Notes (Signed)
S: Walking in hallway to stimulate labor. Pt has not been able to sleep very much due to hip pain.. + CTX, no LOF, VB O: Filed Vitals:   11/10/15 2200 11/10/15 2303 11/11/15 0010 11/11/15 0243  BP: 134/81 127/86 128/82 145/97  Pulse: 96 80 78 93  Temp:  98.1 F (36.7 C)  98.4 F (36.9 C)  TempSrc:  Oral  Oral  Resp:      Height:      Weight:      SpO2:        Gen: NAD, AAOx3      Abd: FNTTP      Ext: Non-tender, Nonedmeatous    FHT:+ mod var + accelerations no decelerations TOCO: irregular SVE: Cervidil intact, no re-evaluated.    A/P:  30 y.o. yo G1P0 at 5956w1d for  IOL for cholestasis.   Labor: being induced  FWB: Reassuring Cat 1 tracing.   GBS: pos with sensitivity to Clindamycin  Contraception: *IUD vs Nexplanon   Sharee Pimplearon W Shaheem Pichon 5:43 AM

## 2015-11-11 NOTE — Progress Notes (Signed)
S: Resting comfortably withoutepidural. + CTX, no LOF, VB O: Filed Vitals:   11/10/15 2042 11/10/15 2200 11/10/15 2303 11/11/15 0010  BP: 133/75 134/81 127/86 128/82  Pulse:  96 80 78  Temp: 98 F (36.7 C)  98.1 F (36.7 C)   TempSrc: Oral  Oral   Resp: 19     Height: 5\' 7"  (1.702 m)     Weight: 260 lb (117.935 kg)     SpO2: 98%       Gen: NAD, AAOx3      Abd: FNTTP      Ext: Non-tender, Nonedmeatous    FHT : +mod var + accelerations no decelerations, Cat 1 TOCO:  irregular SVE:not reevaluated   A/P:  30 y.o. yo G1P0 at 3738w1d for IOL due to cholestasis.   Labor: being induced.   FWB: Reassuring Cat 1 tracing.  GBS: pos  Antibiotics per protocol  Cervidil IOL to start  Continue to monitor uC/FHT's   Sharee Pimplearon W Jones 12:30 AM

## 2015-11-12 LAB — RPR: RPR Ser Ql: NONREACTIVE

## 2015-11-12 MED ORDER — MISOPROSTOL 25 MCG QUARTER TABLET
ORAL_TABLET | ORAL | Status: AC
  Filled 2015-11-12: qty 0.25

## 2015-11-12 MED ORDER — OXYTOCIN 40 UNITS IN LACTATED RINGERS INFUSION - SIMPLE MED
1.0000 m[IU]/min | INTRAVENOUS | Status: DC
  Administered 2015-11-12: 1 m[IU]/min via INTRAVENOUS
  Filled 2015-11-12: qty 1000

## 2015-11-12 MED ORDER — TERBUTALINE SULFATE 1 MG/ML IJ SOLN: 0.2500 mg | Freq: Once | INTRAMUSCULAR | Status: DC | PRN

## 2015-11-12 NOTE — Progress Notes (Signed)
Patient ID: Brenda Fowler, female   DOB: 16-Mar-1986, 30 y.o.   MRN: [id number] Inadequate ctx pattern . On  573mu/min Pitocin . Spoke to nurse to increase pitocin  Reassuring strip  CTX q 6 minutes Cont monitoring

## 2015-11-12 NOTE — Progress Notes (Signed)
Teniya C Gharibian is a 30 y.o. G1P0 at 6636w2d by day 3 induction for cholestasis of pregnancy . Cervidil last pm  Subjective: No c/o   Objective: BP 146/90 mmHg  Pulse 73  Temp(Src) 97.8 F (36.6 C) (Oral)  Resp 14  Ht 5\' 8"  (1.727 m)  Wt 261 lb (118.389 kg)  BMI 39.69 kg/m2  SpO2 98% I/O last 3 completed shifts: In: 295 [P.O.:120; I.V.:125; IV Piggyback:50] Out: -     FHT:  140 "s + accels with scalp stimulation while performing the ROM , IUPC and FSE placed  SVE:   Dilation: 1.5 Effacement (%): 80 Station: -2 Exam by:: ess, RN  Labs: Lab Results  Component Value Date   WBC 10.5 11/10/2015   HGB 9.3* 11/10/2015   HCT 28.3* 11/10/2015   MCV 80.8 11/10/2015   PLT 280 11/10/2015    Assessment / Plan: Induction for cholestasis, reassuring fetal monitoring with AROM  Start pitocin for augmentation  Calub Tarnow 11/12/2015, 9:51 AM

## 2015-11-13 ENCOUNTER — Encounter: Payer: Self-pay | Admitting: Anesthesiology

## 2015-11-13 ENCOUNTER — Encounter
Admission: EM | Disposition: A | Discharge status: Home / Self Care | Payer: Self-pay | Source: Home / Self Care | Attending: Obstetrics and Gynecology

## 2015-11-13 ENCOUNTER — Inpatient Hospital Stay: Payer: BLUE CROSS/BLUE SHIELD | Admitting: Anesthesiology

## 2015-11-13 LAB — CBC
HCT: 27.5 % — ABNORMAL LOW (ref 35.0–47.0)
Hemoglobin: 9.1 g/dL — ABNORMAL LOW (ref 12.0–16.0)
MCH: 26.6 pg (ref 26.0–34.0)
MCHC: 32.9 g/dL (ref 32.0–36.0)
MCV: 80.9 fL (ref 80.0–100.0)
PLATELETS: 258 10*3/uL (ref 150–440)
RBC: 3.4 MIL/uL — ABNORMAL LOW (ref 3.80–5.20)
RDW: 15.3 % — AB (ref 11.5–14.5)
WBC: 12.5 10*3/uL — AB (ref 3.6–11.0)

## 2015-11-13 SURGERY — Surgical Case
Anesthesia: Epidural | Wound class: Clean Contaminated

## 2015-11-13 MED ORDER — PROMETHAZINE HCL 12.5 MG RE SUPP
12.5000 mg | Freq: Four times a day (QID) | RECTAL | Status: DC | PRN
  Filled 2015-11-13: qty 1

## 2015-11-13 MED ORDER — SIMETHICONE 80 MG PO CHEW
80.0000 mg | CHEWABLE_TABLET | ORAL | Status: DC
  Administered 2015-11-14 – 2015-11-15 (×2): 80 mg via ORAL
  Filled 2015-11-13 (×2): qty 1

## 2015-11-13 MED ORDER — BISACODYL 10 MG RE SUPP: 10.0000 mg | Freq: Every day | RECTAL | Status: DC | PRN

## 2015-11-13 MED ORDER — EPHEDRINE 5 MG/ML INJ
10.0000 mg | INTRAVENOUS | Status: DC | PRN
Start: 2015-11-13 — End: 2015-11-13

## 2015-11-13 MED ORDER — SENNOSIDES-DOCUSATE SODIUM 8.6-50 MG PO TABS
2.0000 | ORAL_TABLET | ORAL | Status: DC
  Administered 2015-11-14 – 2015-11-15 (×2): 2 via ORAL
  Filled 2015-11-13 (×2): qty 2

## 2015-11-13 MED ORDER — METHYLERGONOVINE MALEATE 0.2 MG/ML IJ SOLN
INTRAMUSCULAR | Status: AC
  Filled 2015-11-13: qty 1

## 2015-11-13 MED ORDER — IBUPROFEN 600 MG PO TABS
600.0000 mg | ORAL_TABLET | Freq: Four times a day (QID) | ORAL | Status: DC
  Administered 2015-11-14 – 2015-11-16 (×8): 600 mg via ORAL
  Filled 2015-11-13 (×5): qty 1

## 2015-11-13 MED ORDER — OXYTOCIN 10 UNIT/ML IJ SOLN
2.5000 [IU]/h | INTRAMUSCULAR | Status: AC
  Filled 2015-11-13: qty 4

## 2015-11-13 MED ORDER — PHENYLEPHRINE 40 MCG/ML (10ML) SYRINGE FOR IV PUSH (FOR BLOOD PRESSURE SUPPORT)
80.0000 ug | PREFILLED_SYRINGE | INTRAVENOUS | Status: DC | PRN
Start: 2015-11-13 — End: 2015-11-13

## 2015-11-13 MED ORDER — IBUPROFEN 600 MG PO TABS
600.0000 mg | ORAL_TABLET | Freq: Four times a day (QID) | ORAL | Status: DC | PRN
  Filled 2015-11-13 (×4): qty 1

## 2015-11-13 MED ORDER — NALBUPHINE HCL 10 MG/ML IJ SOLN: 5.0000 mg | Freq: Once | INTRAMUSCULAR | Status: DC | PRN

## 2015-11-13 MED ORDER — NALOXONE HCL 0.4 MG/ML IJ SOLN: 0.4000 mg | INTRAMUSCULAR | Status: DC | PRN

## 2015-11-13 MED ORDER — FENTANYL 2.5 MCG/ML W/ROPIVACAINE 0.2% IN NS 100 ML EPIDURAL INFUSION (ARMC-ANES)
EPIDURAL | Status: AC
  Administered 2015-11-13: 10 mL/h via EPIDURAL
  Filled 2015-11-13: qty 100

## 2015-11-13 MED ORDER — MIDAZOLAM HCL 2 MG/2ML IJ SOLN
INTRAMUSCULAR | Status: DC | PRN
  Administered 2015-11-13 (×2): 1 mg via INTRAVENOUS

## 2015-11-13 MED ORDER — PHENYLEPHRINE 40 MCG/ML (10ML) SYRINGE FOR IV PUSH (FOR BLOOD PRESSURE SUPPORT): 80.0000 ug | PREFILLED_SYRINGE | INTRAVENOUS | Status: DC | PRN

## 2015-11-13 MED ORDER — PROCHLORPERAZINE EDISYLATE 5 MG/ML IJ SOLN
10.0000 mg | INTRAMUSCULAR | Status: DC | PRN
  Administered 2015-11-13: 10 mg via INTRAVENOUS
  Filled 2015-11-13 (×3): qty 2

## 2015-11-13 MED ORDER — SIMETHICONE 80 MG PO CHEW: 80.0000 mg | CHEWABLE_TABLET | ORAL | Status: DC | PRN

## 2015-11-13 MED ORDER — SIMETHICONE 80 MG PO CHEW
80.0000 mg | CHEWABLE_TABLET | Freq: Three times a day (TID) | ORAL | Status: DC
  Administered 2015-11-14 – 2015-11-16 (×8): 80 mg via ORAL
  Filled 2015-11-13 (×8): qty 1

## 2015-11-13 MED ORDER — FLEET ENEMA 7-19 GM/118ML RE ENEM: 1.0000 | ENEMA | Freq: Every day | RECTAL | Status: DC | PRN

## 2015-11-13 MED ORDER — DIBUCAINE 1 % RE OINT: 1.0000 "application " | TOPICAL_OINTMENT | RECTAL | Status: DC | PRN

## 2015-11-13 MED ORDER — BUPIVACAINE 0.25 % ON-Q PUMP DUAL CATH 400 ML
INJECTION | Status: AC
Start: 2015-11-13 — End: 2015-11-14
  Filled 2015-11-13: qty 400

## 2015-11-13 MED ORDER — NALOXONE HCL 2 MG/2ML IJ SOSY
1.0000 ug/kg/h | PREFILLED_SYRINGE | INTRAVENOUS | Status: DC | PRN
  Filled 2015-11-13: qty 2

## 2015-11-13 MED ORDER — MENTHOL 3 MG MT LOZG
1.0000 | LOZENGE | OROMUCOSAL | Status: DC | PRN
  Filled 2015-11-13: qty 9

## 2015-11-13 MED ORDER — DEXTROSE 5 % IV SOLN
5.0000 mg/kg | Freq: Once | INTRAVENOUS | Status: AC
  Administered 2015-11-13: 319.6 mg via INTRAVENOUS
  Filled 2015-11-13: qty 8

## 2015-11-13 MED ORDER — SCOPOLAMINE 1 MG/3DAYS TD PT72: 1.0000 | MEDICATED_PATCH | Freq: Once | TRANSDERMAL | Status: DC

## 2015-11-13 MED ORDER — MORPHINE SULFATE (PF) 0.5 MG/ML IJ SOLN
INTRAMUSCULAR | Status: DC | PRN
  Administered 2015-11-13: 2 mg via EPIDURAL

## 2015-11-13 MED ORDER — CLINDAMYCIN PHOSPHATE 900 MG/50ML IV SOLN
900.0000 mg | Freq: Three times a day (TID) | INTRAVENOUS | Status: DC
  Administered 2015-11-13 – 2015-11-15 (×6): 900 mg via INTRAVENOUS
  Filled 2015-11-13 (×7): qty 50

## 2015-11-13 MED ORDER — FENTANYL CITRATE (PF) 100 MCG/2ML IJ SOLN: 25.0000 ug | INTRAMUSCULAR | Status: DC | PRN

## 2015-11-13 MED ORDER — NALBUPHINE HCL 10 MG/ML IJ SOLN: 5.0000 mg | INTRAMUSCULAR | Status: DC | PRN

## 2015-11-13 MED ORDER — TETANUS-DIPHTH-ACELL PERTUSSIS 5-2.5-18.5 LF-MCG/0.5 IM SUSP: 0.5000 mL | Freq: Once | INTRAMUSCULAR | Status: DC

## 2015-11-13 MED ORDER — ONDANSETRON HCL 4 MG/2ML IJ SOLN: 4.0000 mg | Freq: Three times a day (TID) | INTRAMUSCULAR | Status: DC | PRN

## 2015-11-13 MED ORDER — BUPIVACAINE HCL (PF) 0.25 % IJ SOLN
INTRAMUSCULAR | Status: DC | PRN
  Administered 2015-11-13: 5 mL via EPIDURAL

## 2015-11-13 MED ORDER — BUPIVACAINE HCL (PF) 0.5 % IJ SOLN
INTRAMUSCULAR | Status: AC
  Filled 2015-11-13: qty 30

## 2015-11-13 MED ORDER — KETOROLAC TROMETHAMINE 30 MG/ML IJ SOLN
30.0000 mg | Freq: Four times a day (QID) | INTRAMUSCULAR | Status: DC
  Administered 2015-11-13 – 2015-11-14 (×3): 30 mg via INTRAVENOUS
  Filled 2015-11-13 (×3): qty 1

## 2015-11-13 MED ORDER — LACTATED RINGERS IV SOLN
INTRAVENOUS | Status: DC
  Administered 2015-11-14: 06:00:00 via INTRAVENOUS

## 2015-11-13 MED ORDER — ONDANSETRON HCL 4 MG/2ML IJ SOLN: 4.0000 mg | Freq: Once | INTRAMUSCULAR | Status: DC | PRN

## 2015-11-13 MED ORDER — MEASLES, MUMPS & RUBELLA VAC ~~LOC~~ INJ
0.5000 mL | INJECTION | Freq: Once | SUBCUTANEOUS | Status: DC
  Filled 2015-11-13: qty 0.5

## 2015-11-13 MED ORDER — COCONUT OIL OIL
1.0000 "application " | TOPICAL_OIL | Status: DC | PRN
  Filled 2015-11-13: qty 120

## 2015-11-13 MED ORDER — WITCH HAZEL-GLYCERIN EX PADS: 1.0000 "application " | MEDICATED_PAD | CUTANEOUS | Status: DC | PRN

## 2015-11-13 MED ORDER — ACETAMINOPHEN 325 MG PO TABS
650.0000 mg | ORAL_TABLET | ORAL | Status: DC | PRN
  Administered 2015-11-13: 650 mg via ORAL
  Filled 2015-11-13: qty 2

## 2015-11-13 MED ORDER — DIPHENHYDRAMINE HCL 25 MG PO CAPS
25.0000 mg | ORAL_CAPSULE | ORAL | Status: DC | PRN
  Administered 2015-11-15 – 2015-11-16 (×2): 25 mg via ORAL
  Filled 2015-11-13: qty 1

## 2015-11-13 MED ORDER — LACTATED RINGERS IV SOLN: 500.0000 mL | Freq: Once | INTRAVENOUS | Status: DC

## 2015-11-13 MED ORDER — EPHEDRINE 5 MG/ML INJ: 10.0000 mg | INTRAVENOUS | Status: DC | PRN

## 2015-11-13 MED ORDER — DIPHENHYDRAMINE HCL 25 MG PO CAPS
25.0000 mg | ORAL_CAPSULE | Freq: Four times a day (QID) | ORAL | Status: DC | PRN
  Filled 2015-11-13: qty 1

## 2015-11-13 MED ORDER — SODIUM CHLORIDE 0.9% FLUSH: 3.0000 mL | INTRAVENOUS | Status: DC | PRN

## 2015-11-13 MED ORDER — CHLOROPROCAINE HCL (PF) 3 % IJ SOLN
INTRAMUSCULAR | Status: DC | PRN
  Administered 2015-11-13: 20 mL

## 2015-11-13 MED ORDER — DIPHENHYDRAMINE HCL 50 MG/ML IJ SOLN
12.5000 mg | INTRAMUSCULAR | Status: DC | PRN
  Administered 2015-11-14 (×2): 12.5 mg via INTRAVENOUS
  Filled 2015-11-13 (×2): qty 1

## 2015-11-13 MED ORDER — MORPHINE SULFATE (PF) 2 MG/ML IV SOLN: 2.0000 mg | INTRAVENOUS | Status: DC | PRN

## 2015-11-13 MED ORDER — FENTANYL 2.5 MCG/ML W/ROPIVACAINE 0.2% IN NS 100 ML EPIDURAL INFUSION (ARMC-ANES)
10.0000 mL/h | EPIDURAL | Status: DC
  Administered 2015-11-13 (×2): 10 mL/h via EPIDURAL
  Filled 2015-11-13: qty 100

## 2015-11-13 MED ORDER — MEPERIDINE HCL 25 MG/ML IJ SOLN: 6.2500 mg | INTRAMUSCULAR | Status: DC | PRN

## 2015-11-13 MED ORDER — PRENATAL MULTIVITAMIN CH
1.0000 | ORAL_TABLET | Freq: Every day | ORAL | Status: DC
  Administered 2015-11-14 – 2015-11-16 (×3): 1 via ORAL
  Filled 2015-11-13 (×3): qty 1

## 2015-11-13 MED ORDER — GENTAMICIN SULFATE 40 MG/ML IJ SOLN
5.0000 mg/kg | INTRAVENOUS | Status: DC
  Administered 2015-11-14 – 2015-11-15 (×2): 590 mg via INTRAVENOUS
  Filled 2015-11-13 (×5): qty 14.75

## 2015-11-13 MED ORDER — BUPIVACAINE HCL (PF) 0.5 % IJ SOLN
INTRAMUSCULAR | Status: DC | PRN
  Administered 2015-11-13: 10 mL

## 2015-11-13 MED ORDER — DIPHENHYDRAMINE HCL 50 MG/ML IJ SOLN
INTRAMUSCULAR | Status: DC | PRN
  Administered 2015-11-13 (×2): 12.5 mg via INTRAVENOUS

## 2015-11-13 SURGICAL SUPPLY — 25 items
BARRIER ADHS 3X4 INTERCEED (GAUZE/BANDAGES/DRESSINGS) ×3 IMPLANT
CANISTER SUCT 3000ML (MISCELLANEOUS) ×3 IMPLANT
CATH KIT ON-Q SILVERSOAK 5IN (CATHETERS) ×6 IMPLANT
CHLORAPREP W/TINT 26ML (MISCELLANEOUS) ×3 IMPLANT
DRSG TELFA 3X8 NADH (GAUZE/BANDAGES/DRESSINGS) ×3 IMPLANT
ELECT REM PT RETURN 9FT ADLT (ELECTROSURGICAL) ×3
ELECTRODE REM PT RTRN 9FT ADLT (ELECTROSURGICAL) ×1 IMPLANT
GAUZE SPONGE 4X4 12PLY STRL (GAUZE/BANDAGES/DRESSINGS) ×3 IMPLANT
GOWN STRL REUS W/ TWL LRG LVL3 (GOWN DISPOSABLE) ×3 IMPLANT
GOWN STRL REUS W/TWL LRG LVL3 (GOWN DISPOSABLE) ×6
NS IRRIG 1000ML POUR BTL (IV SOLUTION) ×3 IMPLANT
PAD OB MATERNITY 4.3X12.25 (PERSONAL CARE ITEMS) ×3 IMPLANT
PAD PREP 24X41 OB/GYN DISP (PERSONAL CARE ITEMS) ×3 IMPLANT
RTRCTR C-SECT PINK 25CM LRG (MISCELLANEOUS) ×3 IMPLANT
SPONGE LAP 18X18 5 PK (GAUZE/BANDAGES/DRESSINGS) ×6 IMPLANT
SUT MNCRL 4-0 (SUTURE)
SUT MNCRL 4-0 27XMFL (SUTURE)
SUT PDS AB 1 TP1 96 (SUTURE) ×3 IMPLANT
SUT PLAIN 2 0 XLH (SUTURE) ×3 IMPLANT
SUT PLAIN GUT 2-0 30 C14 SG823 (SUTURE)
SUT VIC AB 0 CT1 36 (SUTURE) ×9 IMPLANT
SUT VIC AB 3-0 SH 27 (SUTURE) ×2
SUT VIC AB 3-0 SH 27X BRD (SUTURE) ×1 IMPLANT
SUTURE MNCRL 4-0 27XMF (SUTURE) IMPLANT
SUTURE PLN GUT2-0 30 C14 SG823 (SUTURE) IMPLANT

## 2015-11-13 NOTE — Anesthesia Preprocedure Evaluation (Signed)
Anesthesia Evaluation  Patient identified by MRN, date of birth, ID band Patient awake    Reviewed: Allergy & Precautions, NPO status , Patient's Chart, lab work & pertinent test results, reviewed documented beta blocker date and time   Airway Mallampati: III  TM Distance: >3 FB     Dental  (+) Chipped   Pulmonary former smoker,           Cardiovascular      Neuro/Psych    GI/Hepatic   Endo/Other    Renal/GU      Musculoskeletal   Abdominal   Peds  Hematology   Anesthesia Other Findings Obese.  Reproductive/Obstetrics                             Anesthesia Physical Anesthesia Plan  ASA: III  Anesthesia Plan: Epidural   Post-op Pain Management:    Induction:   Airway Management Planned:   Additional Equipment:   Intra-op Plan:   Post-operative Plan:   Informed Consent: I have reviewed the patients History and Physical, chart, labs and discussed the procedure including the risks, benefits and alternatives for the proposed anesthesia with the patient or authorized representative who has indicated his/her understanding and acceptance.     Plan Discussed with: CRNA  Anesthesia Plan Comments:         Anesthesia Quick Evaluation

## 2015-11-13 NOTE — Progress Notes (Signed)
Dr. Dalbert GarnetBeasley called because she was unable to put in orders from home due to a Epic error. Dr. Dalbert GarnetBeasley ordered clindamycin and gent, patients weight was confirmed and the appropriate doses were ordered. Verbal orders were entered with readback per Dr. Dalbert GarnetBeasley.

## 2015-11-13 NOTE — Plan of Care (Signed)
[facility] in stable condition. Pt's husband at bedside. Infant in nursery at this time. [facility].  Ellison Carwin Donell Tomkins RNC

## 2015-11-13 NOTE — Progress Notes (Signed)
Brenda Fowler is a 30 y.o. G1P0 at 4082w3d by  admitted for induction of labor due to cholestasis of pregnancy, now on induction day #3. Had pit rest 12 hours ago for 1 hour and on pit of 7423mu/min, but contractions not adequate. IUPC in place, 100 mVD units. No evidence of infection.   Subjective: Pt comfortable with epidural  Objective: BP 124/75 mmHg  Pulse 88  Temp(Src) 98.9 F (37.2 C) (Oral)  Resp 14  Ht 5\' 8"  (1.727 m)  Wt 261 lb (118.389 kg)  BMI 39.69 kg/m2  SpO2 99% I/O last 3 completed shifts: In: 2193 [I.V.:93] Out: -     FHT:  FHR: 140 bpm, variability: minimal ,  accelerations:  Present,  decelerations:  Absent UC:   regular, every 3 minutes SVE:   Dilation: 4 Effacement (%): 80 Station: -2 Exam by:: ess, RN  Labs: Lab Results  Component Value Date   WBC 12.5* 11/13/2015   HGB 9.1* 11/13/2015   HCT 27.5* 11/13/2015   MCV 80.9 11/13/2015   PLT 258 11/13/2015    Assessment / Plan: Induction of labor due to cholestasis,  progressing well on pitocin  Labor: Not adequate, will do 3 hour pit rest with hourly temp checks. If trending up, will proceed with C/S prior to meeting infection criteria Fetal Wellbeing:  Category I and Category II - currently with min variability, recently with accels and mod variability. No decels or other concerning signs Pain Control:  Epidural I/D:  on clinda for GBS with PCN allergy Anticipated MOD:  uncertain  Cholestasis: Continue actigall while in labor, d/c when delivered. Will recheck liver enzymes postpartum  Brenda Fowler 11/13/2015, 8:16 AM

## 2015-11-13 NOTE — Progress Notes (Signed)
Brenda Fowler is a 30 y.o. G1P0 at 7061w3d now day 4 of induction . AROM 22 hrs ago and on Pitocin  throughtout the day .CLE in place . STill inadequate ctx pattern with ctx q 3min ans MVU 150. Small cx change from 3.5 to 4 cm overnight . Subjective:   Objective: BP 124/75 mmHg  Pulse 88  Temp(Src) 98.2 F (36.8 C) (Oral)  Resp 14  Ht 5\' 8"  (1.727 m)  Wt 261 lb (118.389 kg)  BMI 39.69 kg/m2  SpO2 99% I/O last 3 completed shifts: In: 4493 [I.V.:93] Out: -     FHT:  FHR: 150  bpm, variability: moderate,  accelerations:  Present,  decelerations:  Absent UC:   regular, every 3 minutes SVE:   Dilation: 4 Effacement (%): 80 Station: -2 Exam by:: ess, RN  Labs: Lab Results  Component Value Date   WBC 10.5 11/10/2015   HGB 9.3* 11/10/2015   HCT 28.3* 11/10/2015   MCV 80.8 11/10/2015   PLT 280 11/10/2015    Assessment / Plan: Inadequate ctx pattern/ MVU, now approaching 24 hr ROM . I would like to continue pitocin to try to achieve adequate ctx pattern . Check CBC. If patient becomes infected and still no cervix change I would then consider c/s  Dr Dalbert GarnetBeasley assuming care at 0800   SCHERMERHORN,THOMAS 11/13/2015, 6:52 AM

## 2015-11-13 NOTE — Progress Notes (Signed)
Patient ID: Brenda Fowler, female   DOB: 12/22/1985, 30 y.o.   MRN: [id number]  29yo G1P0 at 37+3wks on Day#3 of induction of labor for cholestasis of pregnancy, now ruptured >24hrs (30hrs) without cervical change. We have been titrating the pitocin up. Max pitocin 6523mu/min with 2 pitocin rests during this labor course, and now fetal heart tones are minimal variability and non-reactive without positive accels to fetal scalp stim and a baseline change to 155. I am concerned for fetal well being and have called an urgent C/S.   The risks of cesarean section discussed with the patient included but were not limited to: bleeding which may require transfusion or reoperation; infection which may require antibiotics; injury to bowel, bladder, ureters or other surrounding organs; injury to the fetus; need for additional procedures including hysterectomy in the event of a life-threatening hemorrhage; placental abnormalities wth subsequent pregnancies, incisional problems, thromboembolic phenomenon and other postoperative/anesthesia complications. The patient concurred with the proposed plan, giving informed written consent for the procedure.   Patient has been NPO since last night and she will remain NPO for procedure. Anesthesia and OR aware. Preoperative prophylactic antibiotics and SCDs ordered on call to the OR.  To OR when ready.

## 2015-11-13 NOTE — Transfer of Care (Signed)
Immediate Anesthesia Transfer of Care Note  Patient: Brenda Fowler  Procedure(s) Performed: Procedure(s): CESAREAN SECTION (N/A)  Patient Location: PACU and Mother/Baby  Anesthesia Type:Regional  Level of Consciousness: awake, alert  and oriented  Airway & Oxygen Therapy: Patient Spontanous Breathing  Post-op Assessment: Report given to RN and Post -op Vital signs reviewed and stable  Post vital signs: Reviewed and stable  Last Vitals:  Filed Vitals:   11/13/15 1222 11/13/15 1523  BP:  138/90  Pulse:  103  Temp: 37.4 C 37.6 C  Resp:  22    Last Pain:  Filed Vitals:   11/13/15 1523  PainSc: 0-No pain         Complications: No apparent anesthesia complications

## 2015-11-13 NOTE — Anesthesia Procedure Notes (Signed)
Epidural Patient location during procedure: OB  Staffing Anesthesiologist: Berdine AddisonHOMAS, Brenda Fowler Performed by: anesthesiologist   Preanesthetic Checklist Completed: patient identified, site marked, surgical consent, pre-op evaluation, timeout performed, IV checked, risks and benefits discussed and monitors and equipment checked  Epidural Patient position: sitting Prep: Betadine Patient monitoring: heart rate, continuous pulse ox and blood pressure Approach: midline Location: L4-L5 Injection technique: LOR saline  Needle:  Needle type: Tuohy  Needle gauge: 18 G Needle length: 9 cm and 9 Catheter type: closed end flexible Catheter size: 20 Guage Test dose: negative and 1.5% lidocaine with Epi 1:200 K  Assessment Sensory level: T10 Events: blood not aspirated, injection not painful, no injection resistance, negative IV test and no paresthesia  Additional Notes   Patient tolerated the insertion well without complications. 0028 In. 0044 catheter. 0048 bolus 5 ml. 0053 infusion.Reason for block:procedure for pain

## 2015-11-13 NOTE — Op Note (Signed)
Cesarean Section Procedure Note  Date of procedure: 11/13/2015   Pre-operative Diagnosis: Intrauterine pregnancy at [redacted]w[redacted]d; failed induction of labor for cholestasis of pregnancy; rupture of membranes x30 hours; Rh negative, GBS positive allergic to PCN  Post-operative Diagnosis: same, delivered.  Procedure: Primary Low Transverse Cesarean Section through Pfannenstiel incision  Surgeon: Christeen Douglas, MD  Assistant(s):  CST  Anesthesia: Epidural anesthesia  Anesthesiologist: Lezlie Octave, MD Anesthesiologist: Lezlie Octave, MD; Berdine Addison, MD CRNA: Omer Jack, CRNA  Estimated Blood Loss:  600 mL         Drains: On-Q pump placed         Total IV Fluids:  Urine Output:         Specimens: Cord gas, Cord blood         Complications:  None; patient able to continue to feel itching on her feet throughout procedure and was able to move them throughout, which complicated the surgery but did not impair it.          Disposition: PACU - hemodynamically stable.         Condition: stable  Findings:  A female infant "Whitman Hero" in cephalic presentation. Amniotic fluid - Clear  Birth weight 3860 g.  Apgars of 4 and 9 at one and five minutes respectively.  Intact placenta with a three-vessel cord.  Grossly normal uterus, tubes and ovaries bilaterally. No intraabdominal adhesions were noted.  Indications: failed induction  Procedure Details  The patient was taken to Operating Room, identified as the correct patient and the procedure verified as C-Section Delivery. A formal Time Out was held with all team members present and in agreement.  After induction of anesthesia, the patient was draped and prepped in the usual sterile manner. A Pfannenstiel skin incision was made and carried down through the subcutaneous tissue to the fascia. Fascial incision was made and extended transversely with the Mayo scissors. The fascia was separated  from the underlying rectus tissue superiorly and inferiorly. The peritoneum was identified and entered bluntly. Peritoneal incision was extended longitudinally. The utero-vesical peritoneal reflection was incised transversely and a bladder flap was created digitally. An Alexsis retractor was placed to assist in visualization, but the patient was Valsalva-ing throughout procedure, and the omentum superiorly and the bladder inferiorly continued to present, and this retractor was removed in favor of manual exposure.  A low transverse hysterotomy was made. The fetus was was ballotable on exam in the LDR and after hysterotomy the fetus moved to become transverse to the maternal left. A Kiwi vacuum was placed to assist in maintaining the fetal vertex at the hysterotomy; there was one pop-off. The baby was delivered atraumatically. The umbilical cord was clamped x2 and cut and the infant was handed to the awaiting pediatricians. Cord gases and cord blood were collected.The placenta was removed intact and appeared normal, intact, and with a 3-vessel cord.   The uterus was exteriorized and cleared of all clot and debris. The hysterotomy was closed with running sutures of 0-Vicryl. A second imbricating layer was placed with the same suture. Excellent hemostasis was observed. The peritoneal cavity was cleared of all clots and debris. The uterus was returned to the abdomen.   The pelvis was irrigated and again, excellent hemostasis was noted. The On-Q pump was placed without difficulty under direct visualization.The fascia was then reapproximated with running sutures of 0 Vicryl.  The subcutaneous tissue was reapproximated with running sutures of plain gut. The skin was reapproximated  with a 4-0 Monocryl subcuticular stitch, and skin glue placed on top.   Instrument, sponge, and needle counts were correct prior to the abdominal closure and at the conclusion of the case.   The patient tolerated the procedure well and  was transferred to the recovery room in stable condition. Her feet continued to be itchy and distracting despite versed. She did not have a rash or any cutaneous signs other than excoriations from her scratching. During the case, her feet were soothed by the nurse circulator applying deep pressure.  Christeen DouglasBEASLEY, Shivon Hackel, MD 11/13/2015

## 2015-11-14 ENCOUNTER — Inpatient Hospital Stay: Payer: BLUE CROSS/BLUE SHIELD

## 2015-11-14 LAB — CBC
HCT: 25.1 % — ABNORMAL LOW (ref 35.0–47.0)
HEMOGLOBIN: 8.3 g/dL — AB (ref 12.0–16.0)
MCH: 26.3 pg (ref 26.0–34.0)
MCHC: 32.9 g/dL (ref 32.0–36.0)
MCV: 79.7 fL — ABNORMAL LOW (ref 80.0–100.0)
PLATELETS: 252 10*3/uL (ref 150–440)
RBC: 3.15 MIL/uL — ABNORMAL LOW (ref 3.80–5.20)
RDW: 15.5 % — ABNORMAL HIGH (ref 11.5–14.5)
WBC: 19.4 10*3/uL — ABNORMAL HIGH (ref 3.6–11.0)

## 2015-11-14 LAB — FETAL SCREEN: FETAL SCREEN: NEGATIVE

## 2015-11-14 LAB — ANTIBODY SCREEN: ANTIBODY SCREEN: NEGATIVE

## 2015-11-14 MED ORDER — RHO D IMMUNE GLOBULIN 1500 UNIT/2ML IJ SOSY
300.0000 ug | PREFILLED_SYRINGE | Freq: Once | INTRAMUSCULAR | Status: AC
  Administered 2015-11-14: 300 ug via INTRAVENOUS
  Filled 2015-11-14: qty 2

## 2015-11-14 NOTE — Progress Notes (Addendum)
Subjective: Postpartum Day 1: Cesarean Delivery Patient reports chest heaviness starting midnight . Slight SOB . Being treated for PPE with Natasha BenceGent and cleocin  Objective: Vital signs in last 24 hours: Temp:  [97.6 F (36.4 C)-101.4 F (38.6 C)] 97.7 F (36.5 C) (05/02 0745) Pulse Rate:  [86-103] 86 (05/02 0745) Resp:  [12-22] 18 (05/02 0745) BP: (117-160)/(70-90) 129/77 mmHg (05/02 0745) SpO2:  [98 %-100 %] 98 % (05/02 0344)  Physical Exam:  General: alert, cooperative and no distress Lochia: appropriate Uterine Fundus: firm Incision: no significant drainage DVT Evaluation: No evidence of DVT seen on physical exam. Lungs CTA  CV RRR without murmur   Recent Labs  11/13/15 0716 11/14/15 0534  HGB 9.1* 8.3*  HCT 27.5* 25.1*   Wbc 19K  Assessment/Plan: Status post Cesarean section. Difficult taking a deep breath , chest heaviness Will get pulse ox and CXR to look for early pneumonia given fever. Diff dx PE. If symptoms persist will consider spiral Ct scan   Repeat CBC in am  Saline lock IV  Rhogam today  SCHERMERHORN,THOMAS 11/14/2015, 8:43 AM

## 2015-11-14 NOTE — Progress Notes (Signed)
Patient ID: Brenda Fowler, female   DOB: 07/05/1986, 30 y.o.   MRN: [id number] Pulse ox 100% on room air  Continue with CXR and RN will notify me of any changes

## 2015-11-14 NOTE — Anesthesia Postprocedure Evaluation (Signed)
Anesthesia Post Note  Patient: Brenda Fowler  Procedure(s) Performed: Procedure(s) (LRB): CESAREAN SECTION (N/A)  Patient location during evaluation: Mother Baby Anesthesia Type: Epidural Level of consciousness: awake and alert Pain management: pain level controlled Vital Signs Assessment: post-procedure vital signs reviewed and stable Respiratory status: spontaneous breathing, nonlabored ventilation and respiratory function stable Cardiovascular status: stable Postop Assessment: no headache, no backache and epidural receding Anesthetic complications: no    Last Vitals:  Filed Vitals:   11/14/15 0031 11/14/15 0344  BP: 136/74 136/76  Pulse: 86 89  Temp: 36.4 C 37 C  Resp: 18 20    Last Pain:  Filed Vitals:   11/14/15 0349  PainSc: 4                  [name],  [name]

## 2015-11-14 NOTE — Addendum Note (Signed)
Addendum  created 11/14/15 0734 by Stormy FabianLinda Orlan Aversa, CRNA   Modules edited: Notes Section   Notes Section:  File: [id number]

## 2015-11-14 NOTE — Anesthesia Post-op Follow-up Note (Signed)
  Anesthesia Pain Follow-up Note  Patient: Brenda Fowler  Day #: 1  Date of Follow-up: 11/14/2015 Time: 7:32 AM  Last Vitals:  Filed Vitals:   11/14/15 0031 11/14/15 0344  BP: 136/74 136/76  Pulse: 86 89  Temp: 36.4 C 37 C  Resp: 18 20    Level of Consciousness: alert  Pain: 1 /10   Side Effects:None  Catheter Site Exam:clean, dry, no drainage  Plan: D/C from anesthesia care  [name],  Alayshia Marini A

## 2015-11-15 LAB — CBC
HEMATOCRIT: 22 % — AB (ref 35.0–47.0)
HEMOGLOBIN: 7.3 g/dL — AB (ref 12.0–16.0)
MCH: 26.4 pg (ref 26.0–34.0)
MCHC: 33 g/dL (ref 32.0–36.0)
MCV: 80.1 fL (ref 80.0–100.0)
Platelets: 249 10*3/uL (ref 150–440)
RBC: 2.75 MIL/uL — AB (ref 3.80–5.20)
RDW: 15.6 % — ABNORMAL HIGH (ref 11.5–14.5)
WBC: 14 10*3/uL — ABNORMAL HIGH (ref 3.6–11.0)

## 2015-11-15 MED ORDER — FERROUS SULFATE 325 (65 FE) MG PO TABS
325.0000 mg | ORAL_TABLET | Freq: Three times a day (TID) | ORAL | Status: DC
  Administered 2015-11-15 – 2015-11-16 (×4): 325 mg via ORAL
  Filled 2015-11-15 (×4): qty 1

## 2015-11-15 MED ORDER — DOCUSATE SODIUM 100 MG PO CAPS
100.0000 mg | ORAL_CAPSULE | Freq: Two times a day (BID) | ORAL | Status: DC
  Administered 2015-11-15 – 2015-11-16 (×3): 100 mg via ORAL
  Filled 2015-11-15 (×3): qty 1

## 2015-11-15 NOTE — Progress Notes (Signed)
Subjective: Postpartum Day 2: Cesarean Delivery. PPE on gentamycin and cleocin . Afebrile for 34hrs  Patient reports a little dizzy with standing . No more chest heaviness . CXR nl yesterday  Objective: Vital signs in last 24 hours: Temp:  [98 F (36.7 C)-98.6 F (37 C)] 98 F (36.7 C) (05/03 0704) Pulse Rate:  [91-101] 101 (05/03 0704) Resp:  [18-20] 18 (05/03 0704) BP: (133-138)/(78-91) 133/91 mmHg (05/03 0704) SpO2:  [99 %-100 %] 99 % (05/03 0704)  Physical Exam:  General: alert and cooperative Lochia: appropriate Uterine Fundus: firm Incision: healing well DVT Evaluation: No evidence of DVT seen on physical exam.   Recent Labs  11/14/15 0534 11/15/15 0448  HGB 8.3* 7.3*  HCT 25.1* 22.0*    Assessment/Plan: Status post Cesarean section. SOB resolved , PPE now afebrile 34 hr s.   Will add iron TID , if dizziness worsens I will consider blood transfusion . repeat CBC in am   stop abx after 1400 dose today  Anticipate d/c in am    Brenda Fowler 11/15/2015, 8:50 AM

## 2015-11-15 NOTE — Addendum Note (Signed)
Addendum  created 11/15/15 1422 by Stormy FabianLinda Tavie Haseman, CRNA   Modules edited: Notes Section   Notes Section:  File: [id number]

## 2015-11-16 LAB — RHOGAM INJECTION: UNIT DIVISION: 0

## 2015-11-16 LAB — CBC
HCT: 21.4 % — ABNORMAL LOW (ref 35.0–47.0)
HEMOGLOBIN: 6.9 g/dL — AB (ref 12.0–16.0)
MCH: 26.1 pg (ref 26.0–34.0)
MCHC: 32.2 g/dL (ref 32.0–36.0)
MCV: 81 fL (ref 80.0–100.0)
Platelets: 266 10*3/uL (ref 150–440)
RBC: 2.64 MIL/uL — AB (ref 3.80–5.20)
RDW: 15.4 % — ABNORMAL HIGH (ref 11.5–14.5)
WBC: 11.3 10*3/uL — ABNORMAL HIGH (ref 3.6–11.0)

## 2015-11-16 MED ORDER — DOCUSATE SODIUM 100 MG PO CAPS: 100.0000 mg | ORAL_CAPSULE | Freq: Two times a day (BID) | ORAL | Status: DC

## 2015-11-16 MED ORDER — HYDROCODONE-ACETAMINOPHEN 5-325 MG PO TABS: 2.0000 | ORAL_TABLET | Freq: Four times a day (QID) | ORAL | Status: DC | PRN

## 2015-11-16 MED ORDER — IBUPROFEN 600 MG PO TABS: 600.0000 mg | ORAL_TABLET | Freq: Four times a day (QID) | ORAL | Status: DC | PRN

## 2015-11-16 NOTE — Progress Notes (Signed)
Pt discharged home with infant.  Discharge instructions and follow up appointment given to and reviewed with pt.  Pt verbalized understanding.  Escorted by auxillary. 

## 2015-11-16 NOTE — Discharge Summary (Signed)
Obstetric Discharge Summary Reason for Admission: induction of labor Prenatal Procedures: none Intrapartum Procedures: cesarean: low cervical, transverse Postpartum Procedures: none Complications-Operative and Postpartum: pp anemia HEMOGLOBIN  Date Value Ref Range Status  11/16/2015 6.9* 12.0 - 16.0 g/dL Final   HGB  Date Value Ref Range Status  09/30/2012 14.8 12.0-16.0 g/dL Final   HCT  Date Value Ref Range Status  11/16/2015 21.4* 35.0 - 47.0 % Final  09/30/2012 44.2 35.0-47.0 % Final     Physical Exam:  General: alert and cooperative Lochia: appropriate Uterine Fundus: firm Incision: healing well DVT Evaluation: No evidence of DVT seen on physical exam.  Discharge Diagnoses: Term Pregnancy-delivered Intrahepatic cholestasis induction at 37 weeks , fail  Active phase arrest  Discharge Information: Date: 11/16/2015 Activity: pelvic rest Diet: routine Medications: Ibuprofen, Colace, Iron and Vicodin Condition: stable Instructions: refer to practice specific booklet Discharge to: home Follow-up Information    Follow up with Christeen DouglasBEASLEY, BETHANY, MD In 2 weeks.   Specialty:  Obstetrics and Gynecology   Why:  For wound re-check   Contact information:   [address] [place] [id number] [phone]       Newborn Data: Live born female  Birth Weight: 8 lb 8.2 oz (3860 g) APGAR: 4, 9  Home with mother.  Brenda Fowler 11/16/2015, 11:08 AM

## 2016-06-22 ENCOUNTER — Ambulatory Visit (HOSPITAL_COMMUNITY)
Admission: EM | Admit: 2016-06-22 | Discharge: 2016-06-22 | Disposition: A | Discharge status: Home / Self Care | Payer: BLUE CROSS/BLUE SHIELD | Attending: Family Medicine | Admitting: Family Medicine

## 2016-06-22 ENCOUNTER — Encounter (HOSPITAL_COMMUNITY): Payer: Self-pay

## 2016-06-22 DIAGNOSIS — Z87891 Personal history of nicotine dependence: Secondary | ICD-10-CM | POA: Diagnosis not present

## 2016-06-22 DIAGNOSIS — R05 Cough: Secondary | ICD-10-CM | POA: Diagnosis present

## 2016-06-22 DIAGNOSIS — R059 Cough, unspecified: Secondary | ICD-10-CM

## 2016-06-22 DIAGNOSIS — B9789 Other viral agents as the cause of diseases classified elsewhere: Secondary | ICD-10-CM | POA: Diagnosis not present

## 2016-06-22 DIAGNOSIS — R509 Fever, unspecified: Secondary | ICD-10-CM | POA: Insufficient documentation

## 2016-06-22 DIAGNOSIS — J069 Acute upper respiratory infection, unspecified: Secondary | ICD-10-CM | POA: Diagnosis not present

## 2016-06-22 LAB — POCT RAPID STREP A: STREPTOCOCCUS, GROUP A SCREEN (DIRECT): NEGATIVE

## 2016-06-22 NOTE — ED Triage Notes (Signed)
Pt said she has been sick since thur. Night and has been getting worse the past 2 days. Runny nose, headache, fever 102. Did take ibuprofen before she left the house. Has been sleeping a lot. Sore throat on thur. But not bad today. Cough as well. Does have chills and sneezing as well.

## 2016-06-22 NOTE — Discharge Instructions (Signed)
You have a viral or flu like virus. No evidence of a bacterial infection is noted which is good. Antibiotics unfortunately will not be helpful and wont prevent infections in this case. This just needs to run its course. Suggest Mucinex Max strength as directed with Delsym as directed and 800 mg of Ibuprofen (3x a day). Get rest and stay hydrated is key. I hope you feel better soon.

## 2016-06-22 NOTE — ED Provider Notes (Signed)
CSN: [id number]     Arrival date & time 06/22/16  1900 History   None    Chief Complaint  Patient presents with  . URI   (Consider location/radiation/quality/duration/timing/severity/associated sxs/prior Treatment) 30 yo with a 2 day history of fevers, rhinorrhea, sore throat, malaise and dry cough. No known exposures.       Past Medical History:  Diagnosis Date  . Calculus of gallbladder with other cholecystitis, without mention of obstruction 2014  . History of wisdom tooth extraction    Past Surgical History:  Procedure Laterality Date  . CESAREAN SECTION N/A 11/13/2015   Procedure: CESAREAN SECTION;  Surgeon: Christeen DouglasBethany Beasley, MD;  Location: [facility] ORS;  Service: Obstetrics;  Laterality: N/A;  . CHOLECYSTECTOMY  09/30/2012   No family history on file. Social History  Substance Use Topics  . Smoking status: Former Smoker    Quit date: 07/15/2010  . Smokeless tobacco: Never Used  . Alcohol use No   OB History    Gravida Para Term Preterm AB Living   1 1 1     1    SAB TAB Ectopic Multiple Live Births         0 1      Obstetric Comments   LMP-February 2014 Age with first menstruation-16     Review of Systems  Constitutional: Positive for chills, fatigue and fever.  HENT: Positive for congestion, rhinorrhea, sneezing and sore throat.   Respiratory: Positive for cough.   Psychiatric/Behavioral: Negative.     Allergies  Amoxicillin and Ceclor [cefaclor]  Home Medications   Prior to Admission medications   Medication Sig Start Date End Date Taking? Authorizing Provider  ibuprofen (ADVIL,MOTRIN) 600 MG tablet Take 1 tablet (600 mg total) by mouth every 6 (six) hours as needed for mild pain. 11/16/15  Yes Ihor Austinhomas J Schermerhorn, MD  docusate sodium (COLACE) 100 MG capsule Take 1 capsule (100 mg total) by mouth 2 (two) times daily. 11/16/15   Ihor Austinhomas J Schermerhorn, MD  HYDROcodone-acetaminophen (NORCO) 5-325 MG tablet Take 2 tablets by mouth every 6 (six) hours as needed for  moderate pain. 11/16/15   Suzy Bouchardhomas J Schermerhorn, MD   Meds Ordered and Administered this Visit  Medications - No data to display  BP 134/94 (BP Location: Right Arm)   Pulse (!) 125 Comment: notified Janee of pulse  Temp 100 F (37.8 C) (Oral)   Resp 20   LMP 06/22/2016 (Exact Date)   SpO2 99%   Breastfeeding? No  No data found.   Physical Exam  Constitutional: She is oriented to person, place, and time. She appears well-developed and well-nourished. No distress.  HENT:  Head: Normocephalic and atraumatic.  Erythema of the oropharynx, no exudate, clear drainage of the nasal passages  Cardiovascular: Regular rhythm.   115 HR  Pulmonary/Chest: Effort normal and breath sounds normal. No respiratory distress.  Lymphadenopathy:    She has no cervical adenopathy.  Neurological: She is alert and oriented to person, place, and time.  Skin: Skin is warm and dry. She is not diaphoretic.  Psychiatric: Her behavior is normal.  Nursing note and vitals reviewed.   Urgent Care Course   Clinical Course     Procedures (including critical care time)  Labs Review Labs Reviewed  POCT RAPID STREP A    Imaging Review No results found.   Visual Acuity Review  Right Eye Distance:   Left Eye Distance:   Bilateral Distance:    Right Eye Near:   Left Eye Near:  Bilateral Near:         MDM   1. Viral upper respiratory tract infection   2. Cough    No indication for an antibiotic. Treat symptomatically. Suggestions given. FU if worsens.     Riki SheerMichelle G Young, PA-C 06/22/16 1947

## 2016-06-25 LAB — CULTURE, GROUP A STREP (THRC)

## 2017-06-13 ENCOUNTER — Other Ambulatory Visit: Payer: Self-pay

## 2017-06-13 ENCOUNTER — Ambulatory Visit
Admission: EM | Admit: 2017-06-13 | Discharge: 2017-06-13 | Disposition: A | Discharge status: Home / Self Care | Payer: BLUE CROSS/BLUE SHIELD | Attending: Family Medicine | Admitting: Family Medicine

## 2017-06-13 DIAGNOSIS — R6889 Other general symptoms and signs: Secondary | ICD-10-CM

## 2017-06-13 LAB — RAPID INFLUENZA A&B ANTIGENS (ARMC ONLY): INFLUENZA A (ARMC): NEGATIVE

## 2017-06-13 LAB — RAPID INFLUENZA A&B ANTIGENS: Influenza B (ARMC): NEGATIVE

## 2017-06-13 MED ORDER — HYDROCOD POLST-CPM POLST ER 10-8 MG/5ML PO SUER: 5.0000 mL | Freq: Two times a day (BID) | ORAL | 0 refills | Status: DC

## 2017-06-13 MED ORDER — BENZONATATE 200 MG PO CAPS: ORAL_CAPSULE | ORAL | 0 refills | Status: DC

## 2017-06-13 MED ORDER — OSELTAMIVIR PHOSPHATE 75 MG PO CAPS: 75.0000 mg | ORAL_CAPSULE | Freq: Two times a day (BID) | ORAL | 0 refills | Status: DC

## 2017-06-13 NOTE — ED Provider Notes (Signed)
[facility]    CSN: [id number] Arrival date & time: 06/13/17  1812     History   Chief Complaint No chief complaint on file.   HPI Brenda Fowler is a 31 y.o. female.   HPI 31 year old female who presents with a sudden onset of fever cough body aches headache since yesterday.  Not had her flu shot but was planning on getting it next week.  Motrin at 5 PM today.  She states that her temperature at home was 103 degrees orally.  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 Calculus of gallbladder with other cholecystitis, without mention of obstruction 2014  . History of wisdom tooth extraction     Patient Active Problem List   Diagnosis Date Noted  . 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 CESAREAN SECTION N/A 11/13/2015   Procedure: CESAREAN SECTION;  Surgeon: Christeen DouglasBethany Beasley, MD;  Location: [facility] OR[facility];  Service: Obstetrics;  Laterality: N/A;  . CHOLECYSTECTOMY  09/30/2012    OB History    Gravida Para Term Preterm AB Living   1 1 1     1    SAB TAB Ectopic Multiple Live Births         0 1      Obstetric Comments   LMP-February 2014 Age with first menstruation-16       Home Medications    Prior to Admission medications   Medication Sig Start Date End Date Taking? Authorizing Provider  ibuprofen (ADVIL,MOTRIN) 600 MG tablet Take 1 tablet (600 mg total) by mouth every 6 (six) hours as needed for mild pain. 11/16/15  Yes Schermerhorn, Ihor Austinhomas J, MD  norgestimate-ethinyl estradiol (ORTHO-CYCLEN,SPRINTEC,PREVIFEM) 0.25-35 MG-MCG tablet Take 1 tablet by mouth daily.   Yes [provider]  benzonatate (TESSALON) 200 MG capsule Take one cap TID PRN cough 06/13/17   Lutricia Feiloemer, Raynesha Tiedt P, PA-C  chlorpheniramine-HYDROcodone [facility](TUSSIONEX PENNKINETIC ER) 10-8 MG/5ML SUER Take 5 mLs by mouth 2 (two) times daily. 06/13/17    Lutricia Feiloemer, Lavaris Sexson P, PA-C  oseltamivir (TAMIFLU) 75 MG capsule Take 1 capsule (75 mg total) by mouth every 12 (twelve) hours. 06/13/17   Lutricia Feiloemer, Tramon Crescenzo P, PA-C    Family History Family History  Problem Relation Age of Onset  . Cancer Mother     Social History Social History   Tobacco Use  . Smoking status: Former Smoker    Last attempt to quit: 07/15/2010    Years since quitting: 6.9  . Smokeless tobacco: Never Used  Substance Use Topics  . Alcohol use: No  . Drug use: No     Allergies   Amoxicillin and Ceclor [cefaclor]   Review of Systems Review of Systems  Constitutional: Positive for activity change, chills, fatigue and fever.  HENT: Positive for congestion and postnasal drip.   Respiratory: Positive for cough.   All other systems reviewed and are negative.    Physical Exam Triage Vital Signs ED Triage Vitals  Enc Vitals Group     BP 06/13/17 1918 108/79     Pulse Rate 06/13/17 1918 (!) 119     Resp --      Temp 06/13/17 1918 99.2 F (37.3 C)     Temp Source 06/13/17 1918 Oral     SpO2 06/13/17 1918 99 %     Weight 06/13/17 1920 220 lb (99.8  kg)     Height 06/13/17 1920 5\' 6"  (1.676 m)     Head Circumference --      Peak Flow --      Pain Score 06/13/17 1920 5     Pain Loc --      Pain Edu? --      Excl. in GC? --    No data found.  Updated Vital Signs BP 108/79 (BP Location: Left Arm)   Pulse (!) 119   Temp 99.2 F (37.3 C) (Oral)   Ht 5\' 6"  (1.676 m)   Wt 220 lb (99.8 kg)   LMP 06/06/2017 (Approximate)   SpO2 99%   Breastfeeding? No   BMI 35.51 kg/m   Visual Acuity Right Eye Distance:   Left Eye Distance:   Bilateral Distance:    Right Eye Near:   Left Eye Near:    Bilateral Near:     Physical Exam  Constitutional: She is oriented to person, place, and time. She appears well-developed and well-nourished. No distress.  HENT:  Head: Normocephalic.  Right Ear: External ear normal.  Left Ear: External ear normal.  Nose: Nose  normal.  Mouth/Throat: Oropharynx is clear and moist. No oropharyngeal exudate.  Eyes: Pupils are equal, round, and reactive to light.  Neck: Normal range of motion. Neck supple.  Pulmonary/Chest: Effort normal and breath sounds normal.  Musculoskeletal: Normal range of motion.  Neurological: She is alert and oriented to person, place, and time.  Skin: Skin is warm and dry. She is not diaphoretic.  Psychiatric: She has a normal mood and affect. Her behavior is normal. Judgment and thought content normal.  Nursing note and vitals reviewed.    UC Treatments / Results  Labs (all labs ordered are listed, but only abnormal results are displayed) Labs Reviewed  RAPID INFLUENZA A&B ANTIGENS ([facility] ONLY)    EKG  EKG Interpretation None       Radiology No results found.  Procedures Procedures (including critical care time)  Medications Ordered in UC Medications - No data to display   Initial Impression / Assessment and Plan / UC Course  I have reviewed the triage vital signs and the nursing notes.  Pertinent labs & imaging results that were available during my care of the patient were reviewed by me and considered in my medical decision making (see chart for details).     Plan: 1. Test/x-ray results and diagnosis reviewed with patient 2. rx as per orders; risks, benefits, potential side effects reviewed with patient 3. Recommend supportive treatment fluids rest.  Tylenol around-the-clock for body aches and fever.  Given the option of being treated with Tamiflu which she chose.  I would still recommend she have a flu shot after she improves from this illness.  Exercise caution around her 381-1/31-year-old infant.  Do not return to work until your fever has subsided and you are feeling improved.  If you are not improving you may return to our clinic or find a primary care physician. 4. F/u prn if symptoms worsen or don't improve   Final Clinical Impressions(s) / UC Diagnoses    Final diagnoses:  Flu-like symptoms    ED Discharge Orders        Ordered    oseltamivir (TAMIFLU) 75 MG capsule  Every 12 hours     06/13/17 2011    benzonatate (TESSALON) 200 MG capsule     06/13/17 2011    chlorpheniramine-HYDROcodone (TUSSIONEX PENNKINETIC ER) 10-8 MG/5ML SUER  2 times daily  06/13/17 2011       Controlled Substance Prescriptions [place] Controlled Substance Registry consulted? Not Applicable   Lutricia Feil, PA-C 06/13/17 2020

## 2017-06-13 NOTE — ED Triage Notes (Signed)
Patient c/o fever, cough, bodyaches, HA and slight congestion since yesterday. Patient states she took motrin today around 5 pm.

## 2017-08-06 ENCOUNTER — Ambulatory Visit
Admission: EM | Admit: 2017-08-06 | Discharge: 2017-08-06 | Disposition: A | Discharge status: Home / Self Care | Payer: BLUE CROSS/BLUE SHIELD

## 2017-08-06 ENCOUNTER — Other Ambulatory Visit: Payer: Self-pay

## 2017-08-06 DIAGNOSIS — R8290 Unspecified abnormal findings in urine: Secondary | ICD-10-CM

## 2017-08-06 DIAGNOSIS — M545 Low back pain: Secondary | ICD-10-CM | POA: Diagnosis not present

## 2017-08-06 LAB — URINALYSIS, COMPLETE (UACMP) WITH MICROSCOPIC
Bilirubin Urine: NEGATIVE
Glucose, UA: NEGATIVE mg/dL
Ketones, ur: NEGATIVE mg/dL
Nitrite: NEGATIVE
PH: 6.5 (ref 5.0–8.0)
Protein, ur: NEGATIVE mg/dL
SPECIFIC GRAVITY, URINE: 1.02 (ref 1.005–1.030)

## 2017-08-06 MED ORDER — SULFAMETHOXAZOLE-TRIMETHOPRIM 800-160 MG PO TABS: 1.0000 | ORAL_TABLET | Freq: Two times a day (BID) | ORAL | 0 refills | Status: AC

## 2017-08-06 MED ORDER — PREDNISONE 10 MG PO TABS: ORAL_TABLET | ORAL | 0 refills | Status: DC

## 2017-08-06 MED ORDER — KETOROLAC TROMETHAMINE 60 MG/2ML IM SOLN
60.0000 mg | Freq: Once | INTRAMUSCULAR | Status: AC
  Administered 2017-08-06: 60 mg via INTRAMUSCULAR

## 2017-08-06 NOTE — ED Triage Notes (Signed)
Patient complains of low back pain that started yesterday. Patient states that she was seen by the chiropractor today and was send here for possible prednisone. Patient states that she did a tele visit and was given a muscle relaxer (flexeril)

## 2017-08-06 NOTE — ED Provider Notes (Addendum)
[facility] ____________________________________________  Time seen: Approximately 7:10 PM  I have reviewed the triage vital signs and the nursing notes.   HISTORY  Chief Complaint Back Pain   HPI Brenda Fowler is a 32 y.o. female present with husband at bedside for evaluation of low back pain.  Patient reports that yesterday towards the end of an early morning spin class she noticed having low back pain that gradually increase as the day went on.  Denies any fall, direct injury or abrupt onset.  States pain during cycling class was very mild.  Patient reports in the last month she has been doing a lot more physical activity.  States that she has been doing a lot of kickboxing, Lobbyist and cycling classes.  Again denies any direct trauma.  States yesterday she had a hard time getting up and out of the bed, and had to have her husband assist her.  States today she has been moving around better, but still has a hard time with movements.  States pain is all across her low back, denies radiation.  States she thought she noticed some mild burning pain in left buttocks today, but denies any further radiation or paresthesias.  Denies any urinary or bowel retention or incontinence, fevers, rash, skin changes or direct trauma.  States that she has a history of occasional low back pain, but no persistent back pain.  States she was scheduling an appointment for chiropractor tomorrow but has not had a recent adjustment.  Denies recent sickness, fevers.  Patient states that today she noticed her urine had a strong foul odor to it, but still it appeared clear and denies urinary frequency or urgency.  Denies any vaginal discharge, vaginal bleeding or vaginal complaints.  Reports otherwise feels well denies recent sickness. Denies chest pain, shortness of breath, abdominal pain, dysuria, extremity pain, extremity swelling or rash. Denies renal insufficiency.  PCP: Normand Sloop Patient's last menstrual  period was 08/01/2017.  Denies pregnancy   Past Medical History:  Diagnosis Date  . Calculus of gallbladder with other cholecystitis, without mention of obstruction 2014  . History of wisdom tooth extraction     Patient Active Problem List   Diagnosis Date Noted  . 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 CESAREAN SECTION N/A 11/13/2015   Procedure: CESAREAN SECTION;  Surgeon: Christeen Douglas, MD;  Location: [facility] ORS;  Service: Obstetrics;  Laterality: N/A;  . CHOLECYSTECTOMY  09/30/2012     No current facility-administered medications for this encounter.   Current Outpatient Medications:  .  cyclobenzaprine (FLEXERIL) 10 MG tablet, Take 10 mg by mouth 3 (three) times daily as needed for muscle spasms., Disp: , Rfl:  .  norgestimate-ethinyl estradiol (ORTHO-CYCLEN,SPRINTEC,PREVIFEM) 0.25-35 MG-MCG tablet, Take 1 tablet by mouth daily., Disp: , Rfl:  .  predniSONE (DELTASONE) 10 MG tablet, Start 60 mg po day one, then 50 mg po day two, taper by 10 mg daily until complete., Disp: 21 tablet, Rfl: 0 .  sulfamethoxazole-trimethoprim (BACTRIM DS,SEPTRA DS) 800-160 MG tablet, Take 1 tablet by mouth 2 (two) times daily for 3 days., Disp: 6 tablet, Rfl: 0  Allergies Amoxicillin and Ceclor [cefaclor]  Family History  Problem Relation Age of Onset  . Cancer Mother     Social History Social History   Tobacco Use  . Smoking status: Former Smoker    Last attempt to quit: 07/15/2010    Years since  quitting: 7.0  . Smokeless tobacco: Never Used  Substance Use Topics  . Alcohol use: No  . Drug use: No    Review of Systems Constitutional: No fever/chills Cardiovascular: Denies chest pain. Respiratory: Denies shortness of breath. Gastrointestinal: No abdominal pain.  No nausea, no vomiting.  No diarrhea.  No constipation. Genitourinary: Negative for dysuria. As above.  Musculoskeletal:  Positive for back pain. Skin: Negative for rash. Neurological: Negative for headaches, focal weakness or numbness.    ____________________________________________   PHYSICAL EXAM:  VITAL SIGNS: ED Triage Vitals  Enc Vitals Group     BP 08/06/17 1837 112/72     Pulse Rate 08/06/17 1837 97     Resp 08/06/17 1837 17     Temp 08/06/17 1837 98.2 F (36.8 C)     Temp Source 08/06/17 1837 Oral     SpO2 08/06/17 1837 99 %     Weight 08/06/17 1833 220 lb (99.8 kg)     Height 08/06/17 1833 5\' 6"  (1.676 m)     Head Circumference --      Peak Flow --      Pain Score 08/06/17 1835 7     Pain Loc --      Pain Edu? --      Excl. in GC? --     Constitutional: Alert and oriented. Well appearing and in no acute distress. ENT      Head: Normocephalic and atraumatic. Cardiovascular: Normal rate, regular rhythm. Grossly normal heart sounds.  Good peripheral circulation. Respiratory: Normal respiratory effort without tachypnea nor retractions. Breath sounds are clear and equal bilaterally. No wheezes, rales, rhonchi. Gastrointestinal: Soft and nontender.  No CVA tenderness. Musculoskeletal:  Nontender with normal range of motion in all extremities. No midline cervical or thoracic tenderness to palpation. Bilateral pedal pulses equal and easily palpated.      Right lower leg:  No tenderness or edema.      Left lower leg:  No tenderness or edema.  Except: Diffuse lower midline lumbar and bilateral para lumbar tenderness, bilateral piriformis tenderness left greater than right, as well as bilateral greater sciatic notch tenderness, no saddle anesthesia, bilateral plantar flexion dorsiflexion strong and equal,   Mild pain with standing right knee lift, no pain with left knee lift, no paresthesias, 2+ patellar and Achilles reflexes bilaterally, able to weight-bear each leg independently, no swelling, no ecchymosis, no erythema, pain increases with lumbar extension. Neurologic:  Normal speech and  language. No gross focal neurologic deficits are appreciated. Speech is normal. No gait instability.  Skin:  Skin is warm, dry and intact. No rash noted. Psychiatric: Mood and affect are normal. Speech and behavior are normal. Patient exhibits appropriate insight and judgment   ___________________________________________   LABS (all labs ordered are listed, but only abnormal results are displayed)  Labs Reviewed  URINALYSIS, COMPLETE (UACMP) WITH MICROSCOPIC - Abnormal; Notable for the following components:      Result Value   Color, Urine STRAW (*)    APPearance HAZY (*)    Hgb urine dipstick SMALL (*)    Leukocytes, UA SMALL (*)    Squamous Epithelial / LPF 0-5 (*)    Bacteria, UA FEW (*)    All other components within normal limits  URINE CULTURE   ____________________________________________  RADIOLOGY  No results found. ____________________________________________   PROCEDURES Procedures     INITIAL IMPRESSION / ASSESSMENT AND PLAN / ED COURSE  Pertinent labs & imaging results that were available during my care of  the patient were reviewed by me and considered in my medical decision making (see chart for details).  Overall well-appearing patient.  Suspect recent increase of activity with secondary inflammation and muscular strain.  Improved since yesterday after taking muscle relaxants and ibuprofen.  60 mg IM Toradol given once in urgent care.  Will treat patient at home with prednisone and continuing muscle relaxant.  Discussed evaluation of x-ray at this time, as diffuse tenderness without recent trauma will defer x-rays, patient agrees to this and declines x-rays at this time.  Urinalysis reviewed as patient reported noticing odor to urine, and concern for early UTI, will culture urine and typically start patient on 3-day course of Bactrim.  Counseled patient to increase fluids as patient states that she did not drink as much yesterday and today so she did not have to  get up to go to the bathroom.  Encouraged rest, ice, stretching, supportive care.  Discussed strict follow-up and return parameters.  Patient states that she will follow-up with this week discussed strict follow-up and return parameters even up to proceed to the emergency room. Discussed indication, risks and benefits of medications with patient.  Discussed follow up with Primary care physician this week. Discussed follow up and return parameters including no resolution or any worsening concerns. Patient verbalized understanding and agreed to plan.   ____________________________________________   FINAL CLINICAL IMPRESSION(S) / ED DIAGNOSES  Final diagnoses:  Acute bilateral low back pain, with sciatica presence unspecified  Abnormal urine odor     ED Discharge Orders        Ordered    predniSONE (DELTASONE) 10 MG tablet     08/06/17 1945    sulfamethoxazole-trimethoprim (BACTRIM DS,SEPTRA DS) 800-160 MG tablet  2 times daily     08/06/17 1945       Note: This dictation was prepared with Dragon dictation along with smaller phrase technology. Any transcriptional errors that result from this process are unintentional.           Renford DillsMiller, Charla Criscione, NP 08/06/17 2026

## 2017-08-06 NOTE — Discharge Instructions (Signed)
Take medication as prescribed. Rest. Drink plenty of fluids. Ice. Stretch. Avoid over-strenuous activity.   Follow up with your primary care physician this week as discussed. Return to Urgent care or Emergency room for new or worsening concerns.

## 2017-08-09 LAB — URINE CULTURE: Culture: 80000 — AB

## 2017-12-23 IMAGING — CR DG CHEST 2V
1 series · 2 of 2 positions shown · non-contrast
Comparison: None.

CLINICAL DATA: Evaluation of sensation of chest heaviness
postpartum. Patient has had c-section delivery within past 2 days.

EXAM:
CHEST  2 VIEW

[Series 1: dg chest 2 view · 0.14mm/px · 2 of 2 slices shown]
[im 1/2]
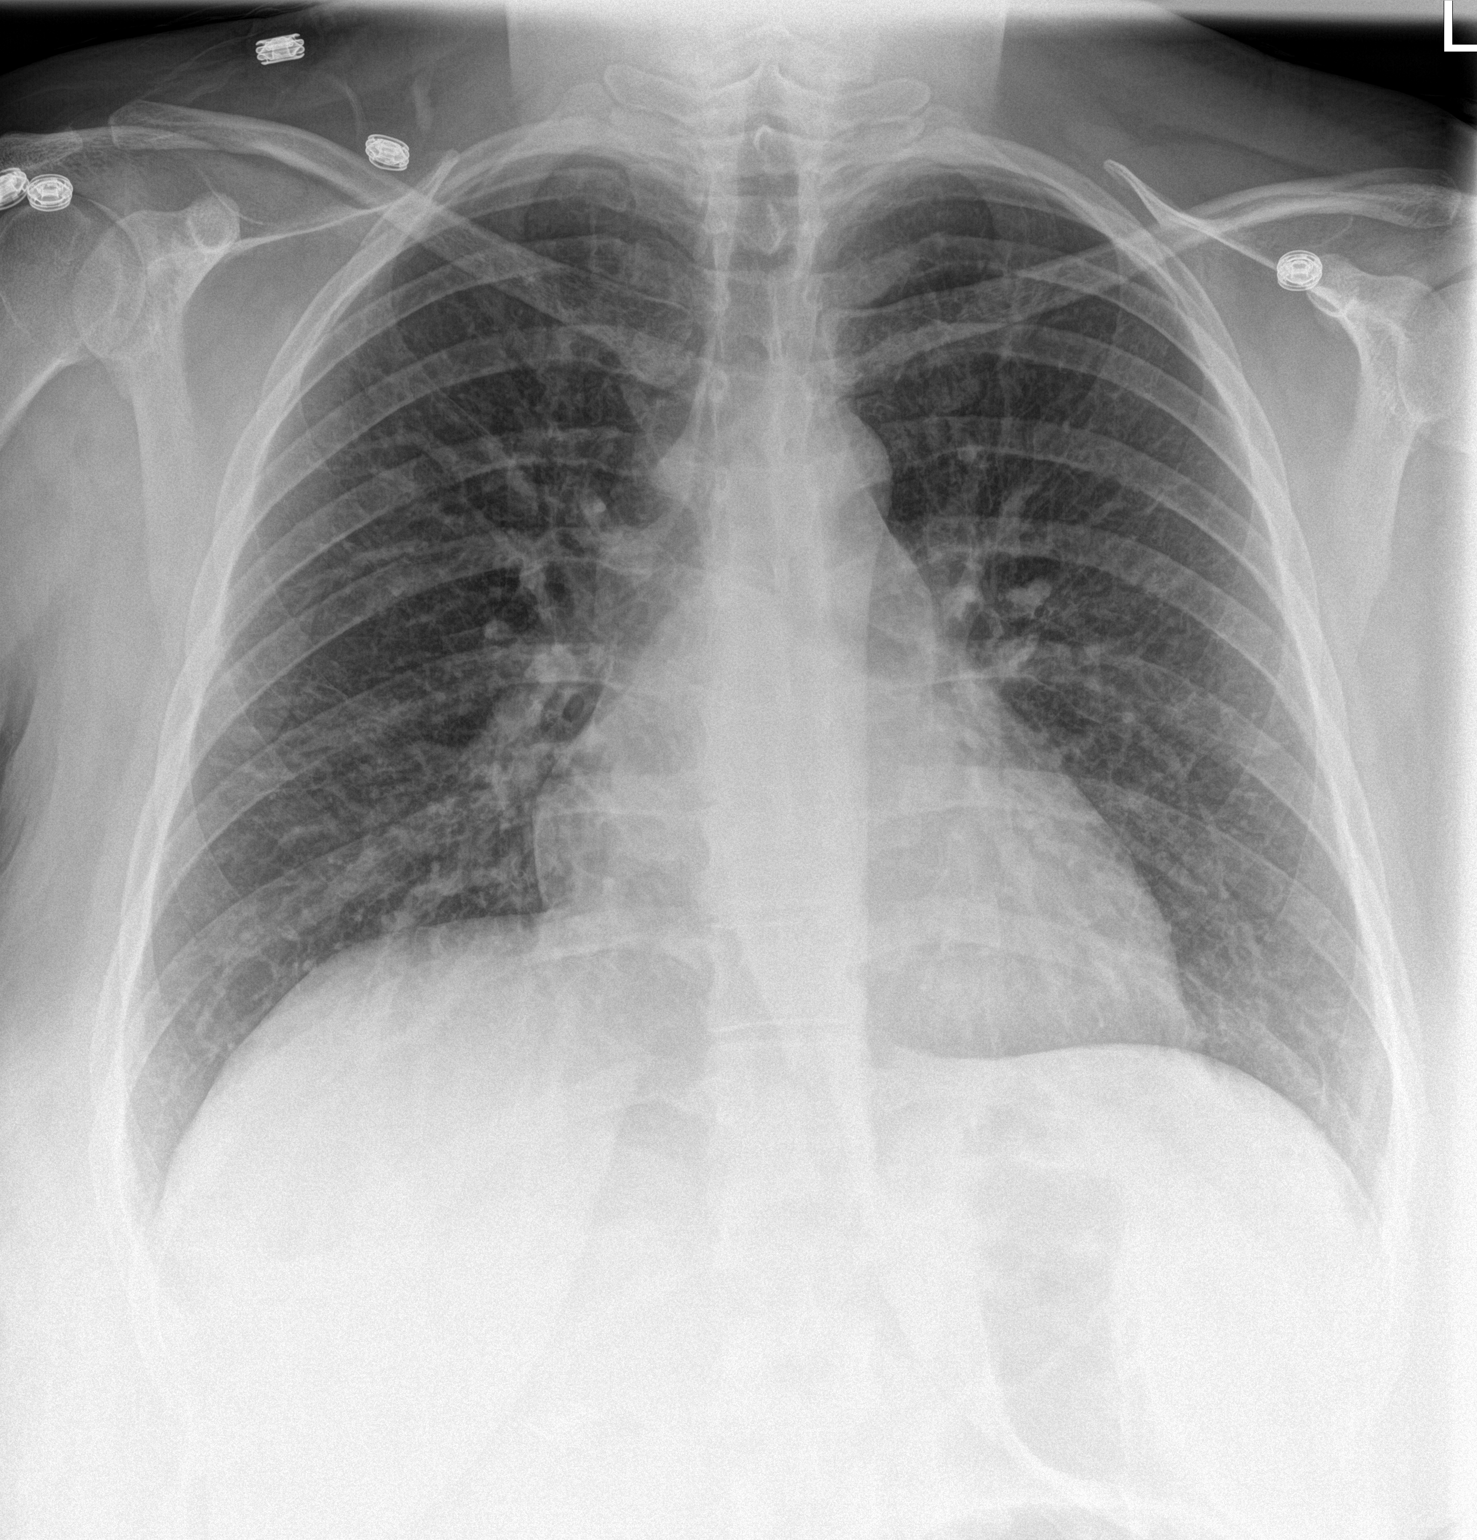
[im 2/2]
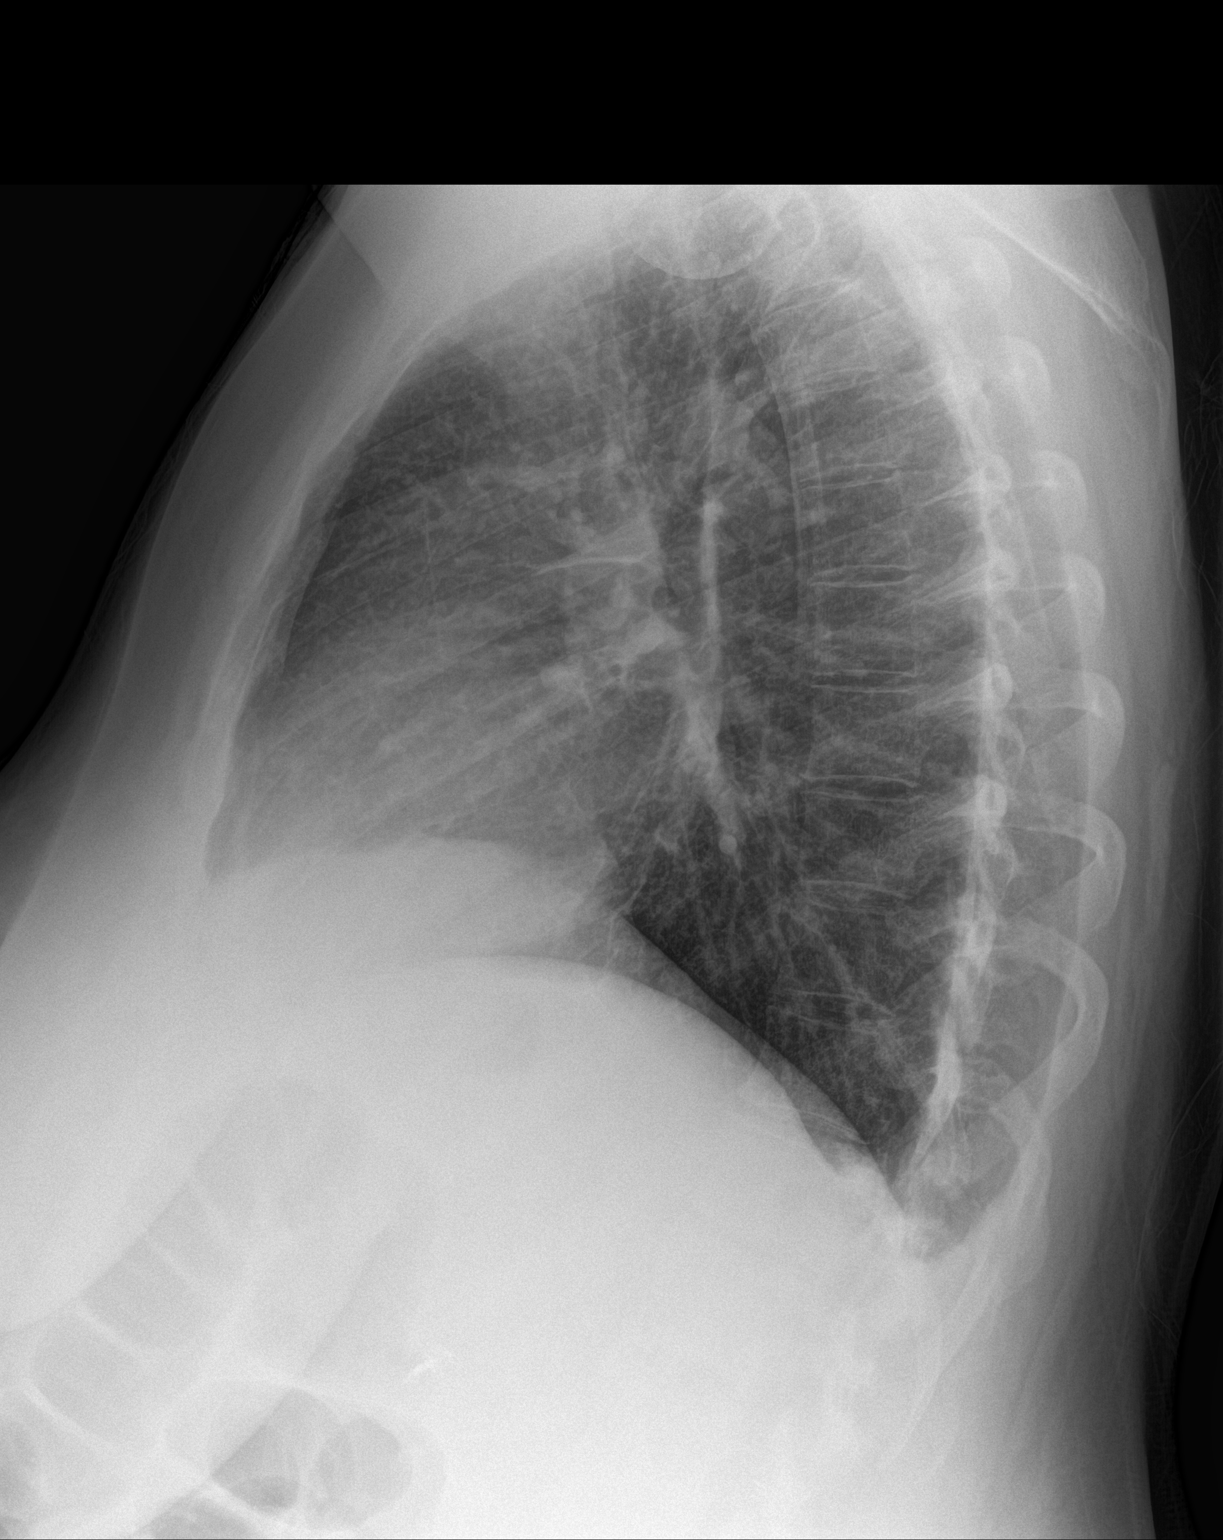

[2 of 2 positions shown; findings below may reference images not displayed]

FINDINGS: Cardiac silhouette is normal in size and configuration. Normal
mediastinal and hilar contours.

Clear lungs.  No pleural effusion or pneumothorax.

Skeletal structures are unremarkable.
IMPRESSION: No active cardiopulmonary disease.

## 2019-05-18 ENCOUNTER — Ambulatory Visit: Payer: BC Managed Care – PPO | Admitting: Podiatry

## 2019-05-18 ENCOUNTER — Encounter: Payer: Self-pay | Admitting: Podiatry

## 2019-05-18 ENCOUNTER — Ambulatory Visit (INDEPENDENT_AMBULATORY_CARE_PROVIDER_SITE_OTHER): Payer: BC Managed Care – PPO

## 2019-05-18 ENCOUNTER — Other Ambulatory Visit: Payer: Self-pay

## 2019-05-18 DIAGNOSIS — J45909 Unspecified asthma, uncomplicated: Secondary | ICD-10-CM | POA: Insufficient documentation

## 2019-05-18 DIAGNOSIS — M79671 Pain in right foot: Secondary | ICD-10-CM | POA: Diagnosis not present

## 2019-05-18 DIAGNOSIS — M2142 Flat foot [pes planus] (acquired), left foot: Secondary | ICD-10-CM | POA: Diagnosis not present

## 2019-05-18 DIAGNOSIS — M722 Plantar fascial fibromatosis: Secondary | ICD-10-CM

## 2019-05-18 DIAGNOSIS — M2141 Flat foot [pes planus] (acquired), right foot: Secondary | ICD-10-CM

## 2019-05-18 DIAGNOSIS — M79672 Pain in left foot: Secondary | ICD-10-CM | POA: Diagnosis not present

## 2019-05-18 DIAGNOSIS — O26843 Uterine size-date discrepancy, third trimester: Secondary | ICD-10-CM | POA: Insufficient documentation

## 2019-05-18 DIAGNOSIS — Z87440 Personal history of urinary (tract) infections: Secondary | ICD-10-CM | POA: Insufficient documentation

## 2019-05-18 DIAGNOSIS — J020 Streptococcal pharyngitis: Secondary | ICD-10-CM | POA: Insufficient documentation

## 2019-05-18 MED ORDER — METHYLPREDNISOLONE 4 MG PO TBPK: ORAL_TABLET | ORAL | 0 refills | Status: DC

## 2019-05-18 NOTE — Progress Notes (Signed)
Subjective:  Patient ID: Brenda Fowler, female    DOB: 06-24-1986,  MRN: [id number]  Chief Complaint  Patient presents with  . Foot Pain    Plantar heel bilateral (L>R) - aching x several months, AM pain, tried night splint, sleeve, taping-no help  . New Patient (Initial Visit)    33 y.o. female presents with the above complaint.  Patient has tried that she has tried over-the-counter medication as well as night splints which has not helped at all.  She states that she has done some stretching exercises however has not been consistent with it.  She wishes to have intervention on bilateral heel pain as well as a possible custom-made orthotics to help address the underlying deformity.  She denies any other acute complaints.  She states her pain is worse when she gets out of bed and progressively worsens throughout the day.   Review of Systems: Negative except as noted in the HPI. Denies N/V/F/Ch.  Past Medical History:  Diagnosis Date  . Calculus of gallbladder with other cholecystitis, without mention of obstruction 2014  . History of wisdom tooth extraction     Current Outpatient Medications:  .  methylPREDNISolone (MEDROL DOSEPAK) 4 MG TBPK tablet, Use as directed, Disp: 1 each, Rfl: 0 .  norgestimate-ethinyl estradiol (ORTHO-CYCLEN,SPRINTEC,PREVIFEM) 0.25-35 MG-MCG tablet, Take 1 tablet by mouth daily., Disp: , Rfl:   Social History   Tobacco Use  Smoking Status Former Smoker  . Quit date: 07/15/2010  . Years since quitting: 8.8  Smokeless Tobacco Never Used    Allergies  Allergen Reactions  . Amoxicillin Rash  . Ceclor [Cefaclor] Rash   Objective:  There were no vitals filed for this visit. There is no height or weight on file to calculate BMI. Constitutional Well developed. Well nourished.  Vascular Dorsalis pedis pulses palpable bilaterally. Posterior tibial pulses palpable bilaterally. Capillary refill normal to all digits.  No cyanosis or clubbing noted. Pedal hair  growth normal.  Neurologic Normal speech. Oriented to person, place, and time. Epicritic sensation to light touch grossly present bilaterally.  Dermatologic Nails well groomed and normal in appearance. No open wounds. No skin lesions.  Orthopedic: Normal joint ROM without pain or crepitus bilaterally. No visible deformities. Tender to palpation at the calcaneal tuber bilaterally. No pain with calcaneal squeeze bilaterally. Ankle ROM full range of motion bilaterally. Silfverskiold Test: negative bilaterally.   Radiographs: Taken and reviewed. No acute fractures or dislocations. No evidence of stress fracture.  Plantar heel spur absent. Posterior heel spur present.  There is a decreasing calcaneal inclination angle increase in talar declination angle increase in Mary's angle  Assessment:   1. Plantar fasciitis   2. Plantar fasciitis, left    Plan:  Patient was evaluated and treated and all questions answered.  Plantar Fasciitis, bilaterally - XR reviewed as above.  - Educated on icing and stretching. Instructions given.  - Injection delivered to the plantar fascia as below. - DME: Plantar Fascial Brace x 2  - Pharmacologic management: Meloxicam/Medrol Dose Pak. Educated on risks/benefits and proper taking of medication.  Bilateral pes planus deformity -I explained to the patient the etiology of plantar fasciitis as a secondary issue to pes planus deformity.  She has a very flexible deformity.  I explained to the patient the etiology of pes planus and all the treatment options available including conservative and surgical. -Patient will benefit from custom-made orthotics.  I have patient seen with within 2 weeks for custom orthotics.  Procedure: Injection Tendon/Ligament Location:  Bilateral plantar fascia at the glabrous junction; medial approach. Skin Prep: alcohol Injectate: 0.5 cc 0.5% marcaine plain, 0.5 cc of 1% Lidocaine, 0.5 cc kenalog 10. Disposition: Patient tolerated  procedure well. Injection site dressed with a band-aid.  Return in about 4 weeks (around 06/15/2019) for Plantar fasciitis, both feet-Dawn to check orthotic coverage, PATIENT NEEDS AVS FOR INSTRUCTION/INFO.

## 2019-05-18 NOTE — Patient Instructions (Signed)

## 2019-06-02 ENCOUNTER — Ambulatory Visit (INDEPENDENT_AMBULATORY_CARE_PROVIDER_SITE_OTHER): Payer: BC Managed Care – PPO | Admitting: Orthotics

## 2019-06-02 ENCOUNTER — Other Ambulatory Visit: Payer: Self-pay

## 2019-06-02 DIAGNOSIS — M722 Plantar fascial fibromatosis: Secondary | ICD-10-CM | POA: Diagnosis not present

## 2019-06-02 DIAGNOSIS — M2141 Flat foot [pes planus] (acquired), right foot: Secondary | ICD-10-CM

## 2019-06-02 NOTE — Progress Notes (Signed)

## 2019-06-15 ENCOUNTER — Ambulatory Visit: Payer: BC Managed Care – PPO | Admitting: Podiatry

## 2019-06-15 ENCOUNTER — Other Ambulatory Visit: Payer: Self-pay

## 2019-06-15 ENCOUNTER — Encounter: Payer: Self-pay | Admitting: Podiatry

## 2019-06-15 DIAGNOSIS — M7732 Calcaneal spur, left foot: Secondary | ICD-10-CM

## 2019-06-15 DIAGNOSIS — M79671 Pain in right foot: Secondary | ICD-10-CM | POA: Diagnosis not present

## 2019-06-15 DIAGNOSIS — M79672 Pain in left foot: Secondary | ICD-10-CM

## 2019-06-15 DIAGNOSIS — M722 Plantar fascial fibromatosis: Secondary | ICD-10-CM

## 2019-06-15 NOTE — Progress Notes (Signed)
  Subjective:  Patient ID: Brenda Fowler, female    DOB: February 21, 1986,  MRN: [id number]  Chief Complaint  Patient presents with  . Follow-up    B/L PF - L>R .. injection had helped right foot improve tremendously . Left foot still has some PF pain. I instructed pt on how to adjust PF brace due to complaints of having some new pain wear the PF brace strap was located.     33 y.o. female presents with the above complaint.  Patient is here on a follow-up of bilateral heel pain.  Patient states that the right side is completely healed.  However on the left side she still has mild pain.  She states that the injection and bracing has considerably helped.  She has been doing her stretching exercises.  She also obtained custom-made orthotics from [place] and is currently breaking them in.  She denies any other acute complaints.   Review of Systems: Negative except as noted in the HPI. Denies N/V/F/Ch.  Past Medical History:  Diagnosis Date  . Calculus of gallbladder with other cholecystitis, without mention of obstruction 2014  . History of wisdom tooth extraction     Current Outpatient Medications:  .  methylPREDNISolone (MEDROL DOSEPAK) 4 MG TBPK tablet, Use as directed, Disp: 1 each, Rfl: 0 .  norgestimate-ethinyl estradiol (ORTHO-CYCLEN,SPRINTEC,PREVIFEM) 0.25-35 MG-MCG tablet, Take 1 tablet by mouth daily., Disp: , Rfl:   Social History   Tobacco Use  Smoking Status Former Smoker  . Quit date: 07/15/2010  . Years since quitting: 8.9  Smokeless Tobacco Never Used    Allergies  Allergen Reactions  . Amoxicillin Rash  . Ceclor [Cefaclor] Rash   Objective:  There were no vitals filed for this visit. There is no height or weight on file to calculate BMI. Constitutional Well developed. Well nourished.  Vascular Dorsalis pedis pulses palpable bilaterally. Posterior tibial pulses palpable bilaterally. Capillary refill normal to all digits.  No cyanosis or clubbing noted. Pedal hair growth  normal.  Neurologic Normal speech. Oriented to person, place, and time. Epicritic sensation to light touch grossly present bilaterally.  Dermatologic Nails well groomed and normal in appearance. No open wounds. No skin lesions.  Orthopedic: Normal joint ROM without pain or crepitus bilaterally. No visible deformities. Tender to palpation at the calcaneal tuber left.  No pain on the right No pain with calcaneal squeeze bilaterally. Ankle ROM full range of motion bilaterally. Silfverskiold Test: negative bilaterally.   Radiographs: Taken and reviewed.  None  Assessment:   1. Plantar fasciitis, left   2. Plantar fasciitis of right foot   3. Pain in both feet    Plan:  Patient was evaluated and treated and all questions answered.  Plantar fasciitis right -Completely resolved  Plantar Fasciitis, left - XR reviewed as above.  - Educated on icing and stretching. Instructions given.  - Injection delivered to the plantar fascia as below. - DME: None - Pharmacologic management: None  Procedure: Injection Tendon/Ligament Location: Left plantar fascia at the glabrous junction; medial approach. Skin Prep: alcohol Injectate: 0.5 cc 0.5% marcaine plain, 0.5 cc of 1% Lidocaine, 0.5 cc kenalog 10. Disposition: Patient tolerated procedure well. Injection site dressed with a band-aid.  No follow-ups on file.

## 2019-06-25 ENCOUNTER — Other Ambulatory Visit: Payer: Self-pay

## 2019-06-25 ENCOUNTER — Ambulatory Visit: Payer: BC Managed Care – PPO | Admitting: Orthotics

## 2019-06-25 DIAGNOSIS — M79672 Pain in left foot: Secondary | ICD-10-CM

## 2019-06-25 DIAGNOSIS — M722 Plantar fascial fibromatosis: Secondary | ICD-10-CM

## 2019-06-25 DIAGNOSIS — M79671 Pain in right foot: Secondary | ICD-10-CM

## 2019-06-25 NOTE — Progress Notes (Signed)
Patient came in today to pick up custom made foot orthotics.  The goals were accomplished and the patient reported no dissatisfaction with said orthotics.  Patient was advised of breakin period and how to report any issues.Patient came in today to pick up custom made foot orthotics.  The goals were accomplished and the patient reported no dissatisfaction with said orthotics.  Patient was advised of breakin period and how to report any issues. 

## 2021-07-16 ENCOUNTER — Other Ambulatory Visit: Payer: Self-pay

## 2021-07-16 ENCOUNTER — Emergency Department
Admission: EM | Admit: 2021-07-16 | Discharge: 2021-07-16 | Disposition: A | Discharge status: Home / Self Care | Payer: BC Managed Care – PPO | Attending: Emergency Medicine | Admitting: Emergency Medicine

## 2021-07-16 DIAGNOSIS — N9489 Other specified conditions associated with female genital organs and menstrual cycle: Secondary | ICD-10-CM | POA: Diagnosis not present

## 2021-07-16 DIAGNOSIS — R197 Diarrhea, unspecified: Secondary | ICD-10-CM | POA: Diagnosis not present

## 2021-07-16 DIAGNOSIS — O26891 Other specified pregnancy related conditions, first trimester: Secondary | ICD-10-CM | POA: Insufficient documentation

## 2021-07-16 DIAGNOSIS — Z20822 Contact with and (suspected) exposure to covid-19: Secondary | ICD-10-CM | POA: Diagnosis not present

## 2021-07-16 DIAGNOSIS — R112 Nausea with vomiting, unspecified: Secondary | ICD-10-CM

## 2021-07-16 DIAGNOSIS — Z3A11 11 weeks gestation of pregnancy: Secondary | ICD-10-CM | POA: Insufficient documentation

## 2021-07-16 DIAGNOSIS — O219 Vomiting of pregnancy, unspecified: Secondary | ICD-10-CM | POA: Insufficient documentation

## 2021-07-16 LAB — BASIC METABOLIC PANEL
Anion gap: 6 (ref 5–15)
BUN: 10 mg/dL (ref 6–20)
CO2: 23 mmol/L (ref 22–32)
Calcium: 8.7 mg/dL — ABNORMAL LOW (ref 8.9–10.3)
Chloride: 107 mmol/L (ref 98–111)
Creatinine, Ser: 0.64 mg/dL (ref 0.44–1.00)
GFR, Estimated: >60 mL/min (ref >60)
Glucose, Bld: 97 mg/dL (ref 70–99)
Potassium: 3.7 mmol/L (ref 3.5–5.1)
Sodium: 136 mmol/L (ref 135–145)

## 2021-07-16 LAB — CBC
HCT: 42.9 % (ref 36.0–46.0)
Hemoglobin: 14.3 g/dL (ref 12.0–15.0)
MCH: 29.7 pg (ref 26.0–34.0)
MCHC: 33.3 g/dL (ref 30.0–36.0)
MCV: 89.2 fL (ref 80.0–100.0)
Platelets: 259 10*3/uL (ref 150–400)
RBC: 4.81 MIL/uL (ref 3.87–5.11)
RDW: 13.6 % (ref 11.5–15.5)
WBC: 8.2 10*3/uL (ref 4.0–10.5)
nRBC: 0 % (ref 0.0–0.2)

## 2021-07-16 LAB — RESP PANEL BY RT-PCR (FLU A&B, COVID) ARPGX2
Influenza A by PCR: NEGATIVE
Influenza B by PCR: NEGATIVE
SARS Coronavirus 2 by RT PCR: NEGATIVE

## 2021-07-16 LAB — HCG, QUANTITATIVE, PREGNANCY: hCG, Beta Chain, Quant, S: 104190 m[IU]/mL — ABNORMAL HIGH (ref <5)

## 2021-07-16 MED ORDER — METOCLOPRAMIDE HCL 10 MG PO TABS: 10.0000 mg | ORAL_TABLET | Freq: Four times a day (QID) | ORAL | 0 refills | Status: DC | PRN

## 2021-07-16 MED ORDER — SODIUM CHLORIDE 0.9 % IV BOLUS
1000.0000 mL | Freq: Once | INTRAVENOUS | Status: AC
  Administered 2021-07-16: 1000 mL via INTRAVENOUS

## 2021-07-16 MED ORDER — ACETAMINOPHEN 325 MG PO TABS
650.0000 mg | ORAL_TABLET | Freq: Once | ORAL | Status: AC
  Administered 2021-07-16: 650 mg via ORAL
  Filled 2021-07-16: qty 2

## 2021-07-16 MED ORDER — ONDANSETRON HCL 4 MG/2ML IJ SOLN
4.0000 mg | Freq: Once | INTRAMUSCULAR | Status: AC
  Administered 2021-07-16: 4 mg via INTRAVENOUS
  Filled 2021-07-16: qty 2

## 2021-07-16 NOTE — ED Triage Notes (Signed)
Pt to ED for vomiting, [redacted] weeks pregnant.  States has been vomiting every 30 mins today. Also reports headache.

## 2021-07-16 NOTE — ED Notes (Addendum)
x

## 2021-07-16 NOTE — ED Provider Notes (Signed)
[facility] Provider Note    Event Date/Time   First MD Initiated Contact with Patient 07/16/21 [phone]     (approximate)   History   Emesis  HPI  Brenda Fowler is a 36 y.o. female with no significant past medical history approximate [redacted] weeks pregnant who presents to the emergency department for nausea vomiting diarrhea.  According to the patient since last night she has been nauseated with frequent episodes of vomiting and diarrhea.  Denies any abdominal pain.  Denies any fever or cough.  Patient states her daughter was sick with a similar illness but that was approximately 2 weeks ago and only lasted 1 day.  Patient denies any cough or congestion.  Denies any dysuria vaginal bleeding or discharge.  Patient states she did have quite a bit of nausea and vomiting with her first pregnancy but has not had significant nausea with this pregnancy as of yet.     Physical Exam   Triage Vital Signs: ED Triage Vitals  Enc Vitals Group     BP 07/16/21 0819 138/89     Pulse Rate 07/16/21 0819 83     Resp 07/16/21 0819 20     Temp 07/16/21 0819 98 F (36.7 C)     Temp Source 07/16/21 0819 Oral     SpO2 07/16/21 0819 97 %     Weight 07/16/21 0820 200 lb (90.7 kg)     Height 07/16/21 0820 5\' 6"  (1.676 m)     Head Circumference --      Peak Flow --      Pain Score 07/16/21 0819 4     Pain Loc --      Pain Edu? --      Excl. in [place]? --     Most recent vital signs: Vitals:   07/16/21 0819  BP: 138/89  Pulse: 83  Resp: 20  Temp: 98 F (36.7 C)  SpO2: 97%    General: Awake, no distress.  Calm cooperative and pleasant. CV:  Good peripheral perfusion.  Regular rate and rhythm around 90 bpm.  No obvious murmur. Resp:  Normal effort.  Equal bilaterally without wheeze rales or rhonchi Abd:  No distention.  Nontender to palpation.    ED Results / Procedures / Treatments   Labs (all labs ordered are listed, but only abnormal results are displayed) Labs Reviewed   BASIC METABOLIC PANEL - Abnormal; Notable for the following components:      Result Value   Calcium 8.7 (*)    All other components within normal limits  RESP PANEL BY RT-PCR (FLU A&B, COVID) ARPGX2  CBC  HCG, QUANTITATIVE, PREGNANCY     MEDICATIONS ORDERED IN ED: Medications  sodium chloride 0.9 % bolus 1,000 mL (has no administration in time range)  acetaminophen (TYLENOL) tablet 650 mg (650 mg Oral Given 07/16/21 0832)  ondansetron (ZOFRAN) injection 4 mg (4 mg Intravenous Given 07/16/21 0832)     IMPRESSION / MDM / ASSESSMENT AND PLAN / ED COURSE  I reviewed the triage vital signs and the nursing notes.                              Differential diagnosis includes, but is not limited to, hyperemesis gravidarum, gastroenteritis, colitis, nausea of pregnancy, infectious etiology such as influenza virus.  Patient presents to the emergency department [redacted] weeks pregnant with nausea and vomiting since last night.  Patient also states  she has experienced diarrhea as well today.  States her daughter had similar symptoms 2 weeks ago but not had any since.  Patient states nausea and vomiting with her first pregnancy but none with the second pregnancy.  Suspect the patient is experiencing gastroenteritis.  Patient has received IV Zofran while waiting for evaluation.  I will order IV fluids 1 L normal saline bolus for the patient.  Patient's lab work is reassuring.  Normal white blood cell count.  Normal electrolytes and reassuring kidney function.  Overall patient appears well does not appear to require admission.  Plan to IV hydrate and likely discharge with OB follow-up.  Patient is already feeling better after Zofran administration.  Patient's COVID and flu test is negative.  Lab work is reassuring.  Patient is feeling much better after medications and fluids.  We will discharge with Reglan to be used at home.  Patient agreeable to plan of care.  Discussed OB follow-up as well as return  precautions.   FINAL CLINICAL IMPRESSION(S) / ED DIAGNOSES   Nausea vomiting    Note:  This document was prepared using Dragon voice recognition software and may include unintentional dictation errors.    Harvest Dark, MD 07/16/21 1104

## 2021-07-18 DIAGNOSIS — O0993 Supervision of high risk pregnancy, unspecified, third trimester: Secondary | ICD-10-CM | POA: Insufficient documentation

## 2021-07-20 LAB — OB RESULTS CONSOLE GC/CHLAMYDIA
Chlamydia: NEGATIVE
Neisseria Gonorrhea: NEGATIVE

## 2021-07-20 LAB — OB RESULTS CONSOLE HIV ANTIBODY (ROUTINE TESTING): HIV: NONREACTIVE

## 2021-07-20 LAB — OB RESULTS CONSOLE RPR: RPR: NONREACTIVE

## 2021-07-20 LAB — OB RESULTS CONSOLE HEPATITIS B SURFACE ANTIGEN: Hepatitis B Surface Ag: NEGATIVE

## 2021-07-20 LAB — OB RESULTS CONSOLE RUBELLA ANTIBODY, IGM: Rubella: IMMUNE

## 2021-07-20 LAB — OB RESULTS CONSOLE VARICELLA ZOSTER ANTIBODY, IGG: Varicella: NON-IMMUNE/NOT IMMUNE

## 2021-12-07 ENCOUNTER — Observation Stay
Admission: EM | Admit: 2021-12-07 | Discharge: 2021-12-07 | Disposition: A | Discharge status: Home / Self Care | Payer: BC Managed Care – PPO | Attending: Obstetrics and Gynecology | Admitting: Obstetrics and Gynecology

## 2021-12-07 ENCOUNTER — Other Ambulatory Visit: Payer: Self-pay

## 2021-12-07 ENCOUNTER — Encounter: Payer: Self-pay | Admitting: Obstetrics and Gynecology

## 2021-12-07 DIAGNOSIS — Z87891 Personal history of nicotine dependence: Secondary | ICD-10-CM | POA: Diagnosis not present

## 2021-12-07 DIAGNOSIS — Z3A37 37 weeks gestation of pregnancy: Secondary | ICD-10-CM | POA: Diagnosis not present

## 2021-12-07 DIAGNOSIS — Z369 Encounter for antenatal screening, unspecified: Secondary | ICD-10-CM | POA: Diagnosis not present

## 2021-12-07 DIAGNOSIS — O0993 Supervision of high risk pregnancy, unspecified, third trimester: Secondary | ICD-10-CM | POA: Diagnosis not present

## 2021-12-07 DIAGNOSIS — Z79899 Other long term (current) drug therapy: Secondary | ICD-10-CM | POA: Insufficient documentation

## 2021-12-07 HISTORY — DX: Anxiety disorder, unspecified: F41.9

## 2021-12-07 NOTE — Progress Notes (Signed)
Positive fetal movement noted upon application of fetal monitors. Brenda Fowler

## 2021-12-11 ENCOUNTER — Encounter: Payer: Self-pay | Admitting: Podiatry

## 2021-12-11 ENCOUNTER — Ambulatory Visit: Payer: BC Managed Care – PPO | Admitting: Podiatry

## 2021-12-11 DIAGNOSIS — S90121A Contusion of right lesser toe(s) without damage to nail, initial encounter: Secondary | ICD-10-CM | POA: Diagnosis not present

## 2021-12-11 NOTE — Progress Notes (Signed)
Subjective:  Patient ID: Brenda Fowler, female    DOB: [birth year],  MRN: [id number]  Chief Complaint  Patient presents with   Foot Pain    36 y.o. female presents with the above complaint.  Patient presents with right fifth digit toe contusion.  Patient states that she stubbed her toe and has been painful since then.  There is some bruising associate with it.  She wanted get evaluated.  She is [redacted] weeks pregnant.  She is unable to get x-rays.  She states it bothers her when she is ambulating.  She has not seen anyone else prior to seeing me.  She denies any other acute complaints.  She wanted to discuss treatment options for this.  She is ambulating in regular shoes.   Review of Systems: Negative except as noted in the HPI. Denies N/V/F/Ch.  Past Medical History:  Diagnosis Date   Anxiety    Calculus of gallbladder with other cholecystitis, without mention of obstruction 07/15/2012   History of wisdom tooth extraction     Current Outpatient Medications:    escitalopram (LEXAPRO) 10 MG tablet, Take 10 mg by mouth daily., Disp: , Rfl:    famotidine (PEPCID) 10 MG tablet, Take 10 mg by mouth 2 (two) times daily., Disp: , Rfl:    ferrous sulfate 325 (65 FE) MG tablet, Take 325 mg by mouth 2 (two) times daily., Disp: , Rfl:    loratadine (CLARITIN) 10 MG tablet, Take 10 mg by mouth daily., Disp: , Rfl:    methylPREDNISolone (MEDROL DOSEPAK) 4 MG TBPK tablet, Use as directed (Patient not taking: Reported on 12/07/2021), Disp: 1 each, Rfl: 0   metoCLOPramide (REGLAN) 10 MG tablet, Take 1 tablet (10 mg total) by mouth every 6 (six) hours as needed for nausea., Disp: 20 tablet, Rfl: 0   norgestimate-ethinyl estradiol (ORTHO-CYCLEN,SPRINTEC,PREVIFEM) 0.25-35 MG-MCG tablet, Take 1 tablet by mouth daily., Disp: , Rfl:    Prenatal Vit-Fe Fumarate-FA (MULTIVITAMIN-PRENATAL) 27-0.8 MG TABS tablet, Take 1 tablet by mouth daily at 12 noon., Disp: , Rfl:   Social History   Tobacco Use  Smoking Status  Former   Types: Cigarettes   Quit date: 07/15/2010   Years since quitting: 11.4  Smokeless Tobacco Never    Allergies  Allergen Reactions   Amoxicillin Rash   Ceclor [Cefaclor] Rash   Objective:  There were no vitals filed for this visit. There is no height or weight on file to calculate BMI. Constitutional Well developed. Well nourished.  Vascular Dorsalis pedis pulses palpable bilaterally. Posterior tibial pulses palpable bilaterally. Capillary refill normal to all digits.  No cyanosis or clubbing noted. Pedal hair growth normal.  Neurologic Normal speech. Oriented to person, place, and time. Epicritic sensation to light touch grossly present bilaterally.  Dermatologic Nails well groomed and normal in appearance. No open wounds. No skin lesions.  Orthopedic: Pain on palpation of right fifth digit.  Pain with range of motion of the fifth at the metatarsophalangeal joint and IPJ joint.  No pain at the fifth metatarsal head and proximally.  Pain primarily located to the digit.  Ecchymosis noted.  Some swelling noted.   Radiographs: None secondary to pregnancy Assessment:   1. Contusion of fifth toe of right foot, initial encounter    Plan:  Patient was evaluated and treated and all questions answered.  Right fifth digit toe contusion -All questions and concerns were discussed with the patient extensive detail -I explained to her that it benefits radiographically fracture the treatment protocol  is buddy splinting and surgical shoe.  I discussed this with the patient I will place her in a prophylactically treated.  She will be weightbearing as tolerated for the next 4 to 6 weeks followed by transition to regular shoe.  I discussed with the patient she states understanding. -Surgical shoe was dispensed  No follow-ups on file.

## 2022-01-11 LAB — OB RESULTS CONSOLE HIV ANTIBODY (ROUTINE TESTING): HIV: NONREACTIVE

## 2022-01-11 LAB — OB RESULTS CONSOLE RPR
RPR: NONREACTIVE
RPR: NONREACTIVE

## 2022-01-11 LAB — OB RESULTS CONSOLE GBS: GBS: NEGATIVE

## 2022-01-18 ENCOUNTER — Encounter
Admission: EM | Disposition: A | Discharge status: Home / Self Care | Payer: Self-pay | Source: Home / Self Care | Attending: Obstetrics and Gynecology

## 2022-01-18 ENCOUNTER — Inpatient Hospital Stay: Payer: BC Managed Care – PPO | Admitting: Certified Registered"

## 2022-01-18 ENCOUNTER — Other Ambulatory Visit: Payer: Self-pay

## 2022-01-18 ENCOUNTER — Inpatient Hospital Stay
Admission: EM | Admit: 2022-01-18 | Discharge: 2022-01-21 | DRG: 786 | Disposition: A | Discharge status: Home / Self Care | Payer: BC Managed Care – PPO | Attending: Obstetrics and Gynecology | Admitting: Obstetrics and Gynecology

## 2022-01-18 ENCOUNTER — Encounter: Payer: Self-pay | Admitting: Obstetrics and Gynecology

## 2022-01-18 DIAGNOSIS — O99214 Obesity complicating childbirth: Secondary | ICD-10-CM | POA: Diagnosis present

## 2022-01-18 DIAGNOSIS — Z87891 Personal history of nicotine dependence: Secondary | ICD-10-CM

## 2022-01-18 DIAGNOSIS — K831 Obstruction of bile duct: Secondary | ICD-10-CM | POA: Diagnosis present

## 2022-01-18 DIAGNOSIS — Z23 Encounter for immunization: Secondary | ICD-10-CM

## 2022-01-18 DIAGNOSIS — O2662 Liver and biliary tract disorders in childbirth: Secondary | ICD-10-CM | POA: Diagnosis present

## 2022-01-18 DIAGNOSIS — Z6791 Unspecified blood type, Rh negative: Secondary | ICD-10-CM

## 2022-01-18 DIAGNOSIS — O26893 Other specified pregnancy related conditions, third trimester: Secondary | ICD-10-CM | POA: Diagnosis present

## 2022-01-18 DIAGNOSIS — O9081 Anemia of the puerperium: Secondary | ICD-10-CM | POA: Diagnosis not present

## 2022-01-18 DIAGNOSIS — Z98891 History of uterine scar from previous surgery: Principal | ICD-10-CM

## 2022-01-18 DIAGNOSIS — O34211 Maternal care for low transverse scar from previous cesarean delivery: Secondary | ICD-10-CM | POA: Diagnosis present

## 2022-01-18 DIAGNOSIS — D62 Acute posthemorrhagic anemia: Secondary | ICD-10-CM | POA: Diagnosis not present

## 2022-01-18 DIAGNOSIS — Z3A37 37 weeks gestation of pregnancy: Secondary | ICD-10-CM

## 2022-01-18 LAB — COMPREHENSIVE METABOLIC PANEL
ALT: 15 U/L (ref 0–44)
AST: 19 U/L (ref 15–41)
Albumin: 2.8 g/dL — ABNORMAL LOW (ref 3.5–5.0)
Alkaline Phosphatase: 108 U/L (ref 38–126)
Anion gap: 6 (ref 5–15)
BUN: 8 mg/dL (ref 6–20)
CO2: 23 mmol/L (ref 22–32)
Calcium: 8.6 mg/dL — ABNORMAL LOW (ref 8.9–10.3)
Chloride: 108 mmol/L (ref 98–111)
Creatinine, Ser: 0.48 mg/dL (ref 0.44–1.00)
GFR, Estimated: >60 mL/min (ref >60)
Glucose, Bld: 85 mg/dL (ref 70–99)
Potassium: 3.5 mmol/L (ref 3.5–5.1)
Sodium: 137 mmol/L (ref 135–145)
Total Bilirubin: 0.5 mg/dL (ref 0.3–1.2)
Total Protein: 6 g/dL — ABNORMAL LOW (ref 6.5–8.1)

## 2022-01-18 LAB — CBC
HCT: 35.6 % — ABNORMAL LOW (ref 36.0–46.0)
Hemoglobin: 11.8 g/dL — ABNORMAL LOW (ref 12.0–15.0)
MCH: 29.4 pg (ref 26.0–34.0)
MCHC: 33.1 g/dL (ref 30.0–36.0)
MCV: 88.6 fL (ref 80.0–100.0)
Platelets: 206 10*3/uL (ref 150–400)
RBC: 4.02 MIL/uL (ref 3.87–5.11)
RDW: 14.9 % (ref 11.5–15.5)
WBC: 10.4 10*3/uL (ref 4.0–10.5)
nRBC: 0 % (ref 0.0–0.2)

## 2022-01-18 LAB — TYPE AND SCREEN
ABO/RH(D): A NEG
Antibody Screen: NEGATIVE

## 2022-01-18 SURGERY — Surgical Case
Anesthesia: Spinal

## 2022-01-18 MED ORDER — COCONUT OIL OIL: 1.0000 | TOPICAL_OIL | Status: DC | PRN

## 2022-01-18 MED ORDER — MEPERIDINE HCL 25 MG/ML IJ SOLN: 6.2500 mg | INTRAMUSCULAR | Status: DC | PRN

## 2022-01-18 MED ORDER — NALOXONE HCL 0.4 MG/ML IJ SOLN: 0.4000 mg | INTRAMUSCULAR | Status: DC | PRN

## 2022-01-18 MED ORDER — MEASLES, MUMPS & RUBELLA VAC IJ SOLR
0.5000 mL | Freq: Once | INTRAMUSCULAR | Status: DC
  Filled 2022-01-18: qty 0.5

## 2022-01-18 MED ORDER — BUPIVACAINE HCL 0.5 % IJ SOLN
INTRAMUSCULAR | Status: DC | PRN
  Administered 2022-01-18: 60 mL

## 2022-01-18 MED ORDER — GABAPENTIN 300 MG PO CAPS
300.0000 mg | ORAL_CAPSULE | Freq: Every day | ORAL | Status: DC
Start: 2022-01-18 — End: 2022-01-21
  Administered 2022-01-18 – 2022-01-20 (×3): 300 mg via ORAL
  Filled 2022-01-18 (×4): qty 1

## 2022-01-18 MED ORDER — KETOROLAC TROMETHAMINE 30 MG/ML IJ SOLN
30.0000 mg | Freq: Four times a day (QID) | INTRAMUSCULAR | Status: AC | PRN
  Administered 2022-01-18: 30 mg via INTRAVENOUS
  Filled 2022-01-18: qty 1

## 2022-01-18 MED ORDER — OXYCODONE HCL 5 MG PO TABS
5.0000 mg | ORAL_TABLET | ORAL | Status: DC | PRN
  Administered 2022-01-19 – 2022-01-21 (×7): 5 mg via ORAL
  Filled 2022-01-18: qty 1
  Filled 2022-01-18 (×5): qty 2

## 2022-01-18 MED ORDER — MORPHINE SULFATE (PF) 0.5 MG/ML IJ SOLN
INTRAMUSCULAR | Status: DC | PRN
  Administered 2022-01-18: .1 mg via INTRATHECAL

## 2022-01-18 MED ORDER — BUPIVACAINE HCL (PF) 0.5 % IJ SOLN
30.0000 mL | Freq: Once | INTRAMUSCULAR | Status: DC
  Filled 2022-01-18: qty 30

## 2022-01-18 MED ORDER — SIMETHICONE 80 MG PO CHEW
80.0000 mg | CHEWABLE_TABLET | Freq: Three times a day (TID) | ORAL | Status: DC
  Administered 2022-01-18 – 2022-01-21 (×7): 80 mg via ORAL
  Filled 2022-01-18 (×8): qty 1

## 2022-01-18 MED ORDER — ACETAMINOPHEN 500 MG PO TABS
1000.0000 mg | ORAL_TABLET | Freq: Four times a day (QID) | ORAL | Status: DC
  Administered 2022-01-18 – 2022-01-21 (×10): 1000 mg via ORAL
  Filled 2022-01-18 (×10): qty 2

## 2022-01-18 MED ORDER — SOD CITRATE-CITRIC ACID 500-334 MG/5ML PO SOLN
30.0000 mL | ORAL | Status: AC
  Administered 2022-01-18: 30 mL via ORAL

## 2022-01-18 MED ORDER — FENTANYL CITRATE (PF) 100 MCG/2ML IJ SOLN: 50.0000 ug | INTRAMUSCULAR | Status: DC | PRN

## 2022-01-18 MED ORDER — BUPIVACAINE LIPOSOME 1.3 % IJ SUSP
20.0000 mL | Freq: Once | INTRAMUSCULAR | Status: DC
Start: 2022-01-18 — End: 2022-01-18
  Filled 2022-01-18: qty 20

## 2022-01-18 MED ORDER — PRENATAL MULTIVITAMIN CH
1.0000 | ORAL_TABLET | Freq: Every day | ORAL | Status: DC
  Administered 2022-01-19 – 2022-01-21 (×3): 1 via ORAL
  Filled 2022-01-18 (×3): qty 1

## 2022-01-18 MED ORDER — DIBUCAINE (PERIANAL) 1 % EX OINT: 1.0000 | TOPICAL_OINTMENT | CUTANEOUS | Status: DC | PRN

## 2022-01-18 MED ORDER — OXYCODONE HCL 5 MG PO TABS
5.0000 mg | ORAL_TABLET | Freq: Four times a day (QID) | ORAL | Status: DC | PRN
  Filled 2022-01-18: qty 1

## 2022-01-18 MED ORDER — TERBUTALINE SULFATE 1 MG/ML IJ SOLN: 0.2500 mg | Freq: Once | INTRAMUSCULAR | Status: DC | PRN

## 2022-01-18 MED ORDER — DIPHENHYDRAMINE HCL 25 MG PO CAPS: 25.0000 mg | ORAL_CAPSULE | ORAL | Status: DC | PRN

## 2022-01-18 MED ORDER — OXYTOCIN-SODIUM CHLORIDE 30-0.9 UT/500ML-% IV SOLN
2.5000 [IU]/h | INTRAVENOUS | Status: DC
  Administered 2022-01-18: 30 [IU] via INTRAVENOUS
  Filled 2022-01-18: qty 500

## 2022-01-18 MED ORDER — ACETAMINOPHEN 325 MG PO TABS: 650.0000 mg | ORAL_TABLET | ORAL | Status: DC | PRN

## 2022-01-18 MED ORDER — VANCOMYCIN HCL 1.5 G IV SOLR
1500.0000 mg | Freq: Once | INTRAVENOUS | Status: AC
  Administered 2022-01-18: 1500 mg via INTRAVENOUS
  Filled 2022-01-18: qty 30

## 2022-01-18 MED ORDER — GENTAMICIN SULFATE 40 MG/ML IJ SOLN
5.0000 mg/kg | INTRAVENOUS | Status: AC
  Administered 2022-01-18: 410 mg via INTRAVENOUS
  Filled 2022-01-18: qty 10.25

## 2022-01-18 MED ORDER — IBUPROFEN 600 MG PO TABS
600.0000 mg | ORAL_TABLET | Freq: Four times a day (QID) | ORAL | Status: DC
  Administered 2022-01-19 – 2022-01-20 (×3): 600 mg via ORAL
  Filled 2022-01-18 (×3): qty 1

## 2022-01-18 MED ORDER — DIPHENHYDRAMINE HCL 25 MG PO CAPS: 25.0000 mg | ORAL_CAPSULE | Freq: Four times a day (QID) | ORAL | Status: DC | PRN

## 2022-01-18 MED ORDER — ONDANSETRON HCL 4 MG/2ML IJ SOLN: 4.0000 mg | Freq: Three times a day (TID) | INTRAMUSCULAR | Status: DC | PRN

## 2022-01-18 MED ORDER — FLEET ENEMA 7-19 GM/118ML RE ENEM: 1.0000 | ENEMA | Freq: Every day | RECTAL | Status: DC | PRN

## 2022-01-18 MED ORDER — LACTATED RINGERS IV SOLN: 500.0000 mL | INTRAVENOUS | Status: DC | PRN

## 2022-01-18 MED ORDER — ONDANSETRON HCL 4 MG/2ML IJ SOLN
INTRAMUSCULAR | Status: AC
  Filled 2022-01-18: qty 2

## 2022-01-18 MED ORDER — EPHEDRINE SULFATE (PRESSORS) 50 MG/ML IJ SOLN
INTRAMUSCULAR | Status: DC | PRN
  Administered 2022-01-18: 10 mg via INTRAVENOUS
  Administered 2022-01-18: 5 mg via INTRAVENOUS

## 2022-01-18 MED ORDER — FENTANYL CITRATE (PF) 100 MCG/2ML IJ SOLN
INTRAMUSCULAR | Status: AC
  Filled 2022-01-18: qty 2

## 2022-01-18 MED ORDER — FENTANYL CITRATE (PF) 100 MCG/2ML IJ SOLN
INTRAMUSCULAR | Status: DC | PRN
Start: 2022-01-18 — End: 2022-01-18
  Administered 2022-01-18: 15 ug via INTRATHECAL

## 2022-01-18 MED ORDER — BUPIVACAINE HCL (PF) 0.5 % IJ SOLN
INTRAMUSCULAR | Status: AC
  Filled 2022-01-18: qty 30

## 2022-01-18 MED ORDER — PHENYLEPHRINE 80 MCG/ML (10ML) SYRINGE FOR IV PUSH (FOR BLOOD PRESSURE SUPPORT)
PREFILLED_SYRINGE | INTRAVENOUS | Status: AC
  Filled 2022-01-18: qty 10

## 2022-01-18 MED ORDER — FENTANYL CITRATE (PF) 100 MCG/2ML IJ SOLN: 25.0000 ug | INTRAMUSCULAR | Status: DC | PRN

## 2022-01-18 MED ORDER — LACTATED RINGERS IV SOLN: INTRAVENOUS | Status: DC

## 2022-01-18 MED ORDER — WITCH HAZEL-GLYCERIN EX PADS: 1.0000 | MEDICATED_PAD | CUTANEOUS | Status: DC | PRN

## 2022-01-18 MED ORDER — OXYTOCIN BOLUS FROM INFUSION: 333.0000 mL | Freq: Once | INTRAVENOUS | Status: DC

## 2022-01-18 MED ORDER — BUPIVACAINE IN DEXTROSE 0.75-8.25 % IT SOLN
INTRATHECAL | Status: DC | PRN
  Administered 2022-01-18: 1.6 mL via INTRATHECAL

## 2022-01-18 MED ORDER — DEXAMETHASONE SODIUM PHOSPHATE 10 MG/ML IJ SOLN
INTRAMUSCULAR | Status: DC | PRN
  Administered 2022-01-18: 8 mg via INTRAVENOUS

## 2022-01-18 MED ORDER — SENNOSIDES-DOCUSATE SODIUM 8.6-50 MG PO TABS
2.0000 | ORAL_TABLET | ORAL | Status: DC
  Administered 2022-01-19 – 2022-01-20 (×2): 2 via ORAL
  Filled 2022-01-18 (×2): qty 2

## 2022-01-18 MED ORDER — KETOROLAC TROMETHAMINE 30 MG/ML IJ SOLN: 30.0000 mg | Freq: Four times a day (QID) | INTRAMUSCULAR | Status: AC | PRN

## 2022-01-18 MED ORDER — SODIUM CHLORIDE (PF) 0.9 % IJ SOLN
INTRAMUSCULAR | Status: AC
  Filled 2022-01-18: qty 50

## 2022-01-18 MED ORDER — LORATADINE 10 MG PO TABS
10.0000 mg | ORAL_TABLET | Freq: Every day | ORAL | Status: DC
  Administered 2022-01-18 – 2022-01-21 (×4): 10 mg via ORAL
  Filled 2022-01-18 (×5): qty 1

## 2022-01-18 MED ORDER — KETOROLAC TROMETHAMINE 30 MG/ML IJ SOLN
30.0000 mg | Freq: Four times a day (QID) | INTRAMUSCULAR | Status: AC
  Administered 2022-01-18 – 2022-01-19 (×4): 30 mg via INTRAVENOUS
  Filled 2022-01-18 (×4): qty 1

## 2022-01-18 MED ORDER — PHENYLEPHRINE HCL (PRESSORS) 10 MG/ML IV SOLN
INTRAVENOUS | Status: DC | PRN
  Administered 2022-01-18 (×5): 160 ug via INTRAVENOUS

## 2022-01-18 MED ORDER — SIMETHICONE 80 MG PO CHEW
80.0000 mg | CHEWABLE_TABLET | ORAL | Status: DC | PRN
  Administered 2022-01-19: 80 mg via ORAL

## 2022-01-18 MED ORDER — OXYTOCIN-SODIUM CHLORIDE 30-0.9 UT/500ML-% IV SOLN
INTRAVENOUS | Status: AC
  Filled 2022-01-18: qty 500

## 2022-01-18 MED ORDER — DIPHENHYDRAMINE HCL 50 MG/ML IJ SOLN: 12.5000 mg | INTRAMUSCULAR | Status: DC | PRN

## 2022-01-18 MED ORDER — ONDANSETRON HCL 4 MG/2ML IJ SOLN
INTRAMUSCULAR | Status: DC | PRN
  Administered 2022-01-18: 4 mg via INTRAVENOUS

## 2022-01-18 MED ORDER — PHENYLEPHRINE HCL-NACL 20-0.9 MG/250ML-% IV SOLN
INTRAVENOUS | Status: DC | PRN
  Administered 2022-01-18: 50 ug/min via INTRAVENOUS

## 2022-01-18 MED ORDER — PROMETHAZINE HCL 25 MG/ML IJ SOLN: 6.2500 mg | INTRAMUSCULAR | Status: DC | PRN

## 2022-01-18 MED ORDER — PANTOPRAZOLE SODIUM 20 MG PO TBEC
20.0000 mg | DELAYED_RELEASE_TABLET | Freq: Every day | ORAL | Status: DC
  Administered 2022-01-19: 20 mg via ORAL
  Filled 2022-01-18 (×6): qty 1

## 2022-01-18 MED ORDER — FERROUS SULFATE 325 (65 FE) MG PO TABS
325.0000 mg | ORAL_TABLET | Freq: Two times a day (BID) | ORAL | Status: DC
Start: 2022-01-18 — End: 2022-01-21
  Administered 2022-01-19 – 2022-01-21 (×5): 325 mg via ORAL
  Filled 2022-01-18 (×5): qty 1

## 2022-01-18 MED ORDER — NALOXONE HCL 4 MG/10ML IJ SOLN: 1.0000 ug/kg/h | INTRAVENOUS | Status: DC | PRN

## 2022-01-18 MED ORDER — ESCITALOPRAM OXALATE 10 MG PO TABS
10.0000 mg | ORAL_TABLET | Freq: Every day | ORAL | Status: DC
  Administered 2022-01-18: 10 mg via ORAL
  Filled 2022-01-18: qty 1

## 2022-01-18 MED ORDER — ONDANSETRON HCL 4 MG/2ML IJ SOLN: 4.0000 mg | Freq: Four times a day (QID) | INTRAMUSCULAR | Status: DC | PRN

## 2022-01-18 MED ORDER — SOD CITRATE-CITRIC ACID 500-334 MG/5ML PO SOLN: 30.0000 mL | ORAL | Status: DC | PRN

## 2022-01-18 MED ORDER — LIDOCAINE HCL (PF) 1 % IJ SOLN: 30.0000 mL | INTRAMUSCULAR | Status: DC | PRN

## 2022-01-18 MED ORDER — OXYTOCIN-SODIUM CHLORIDE 30-0.9 UT/500ML-% IV SOLN
2.5000 [IU]/h | INTRAVENOUS | Status: AC
  Administered 2022-01-18 – 2022-01-19 (×2): 2.5 [IU]/h via INTRAVENOUS
  Filled 2022-01-18: qty 500

## 2022-01-18 MED ORDER — GABAPENTIN 300 MG PO CAPS
300.0000 mg | ORAL_CAPSULE | ORAL | Status: AC
  Administered 2022-01-18: 300 mg via ORAL
  Filled 2022-01-18: qty 1

## 2022-01-18 MED ORDER — TETANUS-DIPHTH-ACELL PERTUSSIS 5-2.5-18.5 LF-MCG/0.5 IM SUSY
0.5000 mL | PREFILLED_SYRINGE | Freq: Once | INTRAMUSCULAR | Status: DC
  Filled 2022-01-18: qty 0.5

## 2022-01-18 MED ORDER — EPHEDRINE 5 MG/ML INJ
INTRAVENOUS | Status: AC
  Filled 2022-01-18: qty 5

## 2022-01-18 MED ORDER — OXYTOCIN-SODIUM CHLORIDE 30-0.9 UT/500ML-% IV SOLN: 1.0000 m[IU]/min | INTRAVENOUS | Status: DC

## 2022-01-18 MED ORDER — SODIUM CHLORIDE 0.9% FLUSH
50.0000 mL | Freq: Once | INTRAVENOUS | Status: DC
  Filled 2022-01-18: qty 51

## 2022-01-18 MED ORDER — BISACODYL 10 MG RE SUPP: 10.0000 mg | Freq: Every day | RECTAL | Status: DC | PRN

## 2022-01-18 MED ORDER — DEXAMETHASONE SODIUM PHOSPHATE 10 MG/ML IJ SOLN
INTRAMUSCULAR | Status: AC
  Filled 2022-01-18: qty 1

## 2022-01-18 MED ORDER — MENTHOL 3 MG MT LOZG: 1.0000 | LOZENGE | OROMUCOSAL | Status: DC | PRN

## 2022-01-18 MED ORDER — MORPHINE SULFATE (PF) 0.5 MG/ML IJ SOLN
INTRAMUSCULAR | Status: AC
  Filled 2022-01-18: qty 10

## 2022-01-18 MED ORDER — SODIUM CHLORIDE 0.9% FLUSH: 3.0000 mL | INTRAVENOUS | Status: DC | PRN

## 2022-01-18 MED ORDER — ACETAMINOPHEN 500 MG PO TABS
1000.0000 mg | ORAL_TABLET | ORAL | Status: AC
  Administered 2022-01-18: 1000 mg via ORAL
  Filled 2022-01-18: qty 2

## 2022-01-18 SURGICAL SUPPLY — 31 items
BACTOSHIELD CHG 4% 4OZ (MISCELLANEOUS) ×1
CHLORAPREP W/TINT 26 (MISCELLANEOUS) ×2 IMPLANT
DRSG TELFA 3X8 NADH (GAUZE/BANDAGES/DRESSINGS) ×2 IMPLANT
ELECT REM PT RETURN 9FT ADLT (ELECTROSURGICAL) ×2
ELECTRODE REM PT RTRN 9FT ADLT (ELECTROSURGICAL) ×1 IMPLANT
GAUZE SPONGE 4X4 12PLY STRL (GAUZE/BANDAGES/DRESSINGS) ×2 IMPLANT
GOWN STRL REUS W/ TWL LRG LVL3 (GOWN DISPOSABLE) ×3 IMPLANT
GOWN STRL REUS W/TWL LRG LVL3 (GOWN DISPOSABLE) ×3
MANIFOLD NEPTUNE II (INSTRUMENTS) ×2 IMPLANT
MAT PREVALON FULL STRYKER (MISCELLANEOUS) ×2 IMPLANT
NDL HYPO 25GX1X1/2 BEV (NEEDLE) ×1 IMPLANT
NEEDLE HYPO 25GX1X1/2 BEV (NEEDLE) ×2 IMPLANT
NS IRRIG 1000ML POUR BTL (IV SOLUTION) ×2 IMPLANT
PACK C SECTION AR (MISCELLANEOUS) ×2 IMPLANT
PAD DRESSING TELFA 3X8 NADH (GAUZE/BANDAGES/DRESSINGS) ×1 IMPLANT
PAD OB MATERNITY 4.3X12.25 (PERSONAL CARE ITEMS) ×2 IMPLANT
PAD PREP 24X41 OB/GYN DISP (PERSONAL CARE ITEMS) ×2 IMPLANT
RETRACTOR WND ALEXIS-O 25 LRG (MISCELLANEOUS) IMPLANT
RTRCTR WOUND ALEXIS O 25CM LRG (MISCELLANEOUS) ×2
SCRUB CHG 4% DYNA-HEX 4OZ (MISCELLANEOUS) ×1 IMPLANT
SUT MNCRL 4-0 (SUTURE) ×1
SUT MNCRL 4-0 27XMFL (SUTURE) ×1
SUT VIC AB 0 CT1 36 (SUTURE) ×4 IMPLANT
SUT VIC AB 0 CTX 36 (SUTURE) ×2
SUT VIC AB 0 CTX36XBRD ANBCTRL (SUTURE) ×2 IMPLANT
SUT VIC AB 2-0 SH 27 (SUTURE) ×2
SUT VIC AB 2-0 SH 27XBRD (SUTURE) ×2 IMPLANT
SUT VICRYL 3-0 27IN SH (SUTURE) ×1 IMPLANT
SUTURE MNCRL 4-0 27XMF (SUTURE) ×1 IMPLANT
SYR 30ML LL (SYRINGE) ×4 IMPLANT
WATER STERILE IRR 500ML POUR (IV SOLUTION) ×2 IMPLANT

## 2022-01-18 NOTE — Anesthesia Procedure Notes (Signed)
Spinal  Patient location during procedure: OR Reason for block: surgical anesthesia Staffing Performed: resident/CRNA  Anesthesiologist: Martha Clan, MD Resident/CRNA: Rolla Plate, CRNA Performed by: Rolla Plate, CRNA Authorized by: Martha Clan, MD   Preanesthetic Checklist Completed: patient identified, IV checked, site marked, risks and benefits discussed, surgical consent, monitors and equipment checked, pre-op evaluation and timeout performed Spinal Block Patient position: sitting Prep: ChloraPrep and site prepped and draped Patient monitoring: heart rate, continuous pulse ox, blood pressure and cardiac monitor Approach: midline Location: L4-5 Injection technique: single-shot Needle Needle type: Whitacre and Introducer  Needle gauge: 24 G Needle length: 9 cm Assessment Sensory level: T4 Events: CSF return Additional Notes Negative paresthesia. Negative blood return. Positive free-flowing CSF. Expiration date of kit checked and confirmed. Patient tolerated procedure well, without complications.

## 2022-01-18 NOTE — H&P (Signed)
OB History & Physical   History of Present Illness:  Chief Complaint: scheduled IOL  HPI:  Brenda Fowler is a 36 y.o. G70P1001 female at [redacted]w[redacted]d dated by LMP and [place] at 6wks, EDD 02/03/22.  She presents to L&D for scheduled IOL due to cholestasis.   Active FM, denies UCs, LOF or VB.   Pregnancy Issues: 1. Cholestasis dx at 30wks 2. Prior CS- failed IOL 3. Obesity, BMI 41.8 4. RH Neg 5. Hx GHTN 6. Varicella NON-immune   Maternal Medical History:   Past Medical History:  Diagnosis Date   Anxiety    Calculus of gallbladder with other cholecystitis, without mention of obstruction 07/15/2012   History of wisdom tooth extraction     Past Surgical History:  Procedure Laterality Date   CESAREAN SECTION N/A 11/13/2015   Procedure: CESAREAN SECTION;  Surgeon: Christeen Douglas, MD;  Location: [facility] ORS;  Service: Obstetrics;  Laterality: N/A;   CHOLECYSTECTOMY  09/30/2012    Allergies  Allergen Reactions   Amoxicillin Rash   Ceclor [Cefaclor] Rash    Prior to Admission medications   Medication Sig Start Date End Date Taking? Authorizing Provider  escitalopram (LEXAPRO) 10 MG tablet Take 10 mg by mouth daily.   Yes [provider]  famotidine (PEPCID) 10 MG tablet Take 10 mg by mouth 2 (two) times daily.   Yes [provider]  loratadine (CLARITIN) 10 MG tablet Take 10 mg by mouth daily.   Yes [provider]  pantoprazole (PROTONIX) 20 MG tablet Take 20 mg by mouth daily.   Yes [provider]  Prenatal Vit-Fe Fumarate-FA (MULTIVITAMIN-PRENATAL) 27-0.8 MG TABS tablet Take 1 tablet by mouth daily at 12 noon.   Yes [provider]  ferrous sulfate 325 (65 FE) MG tablet Take 325 mg by mouth 2 (two) times daily. Patient not taking: Reported on 01/18/2022    [provider]  methylPREDNISolone (MEDROL DOSEPAK) 4 MG TBPK tablet Use as directed Patient not taking: Reported on 12/07/2021 05/18/19   Candelaria Stagers, DPM  metoCLOPramide (REGLAN) 10  MG tablet Take 1 tablet (10 mg total) by mouth every 6 (six) hours as needed for nausea. Patient not taking: Reported on 01/18/2022 07/16/21   Minna Antis, MD  norgestimate-ethinyl estradiol (ORTHO-CYCLEN,SPRINTEC,PREVIFEM) 0.25-35 MG-MCG tablet Take 1 tablet by mouth daily. Patient not taking: Reported on 01/18/2022    [provider]     Prenatal care site: [facility]   Social History: She  reports that she quit smoking about 11 years ago. Her smoking use included cigarettes. She has never used smokeless tobacco. She reports that she does not drink alcohol and does not use drugs.  Family History: family history includes Cancer in her mother.   Review of Systems: A full review of systems was performed and negative except as noted in the HPI.     Physical Exam:  Vital Signs: Temp 98.2 F (36.8 C) (Oral)   Ht 5\' 6"  (1.676 m)   Wt 117.5 kg   BMI 41.80 kg/m   General: no acute distress.  HEENT: normocephalic, atraumatic Heart: regular rate & rhythm.  No murmurs/rubs/gallops Lungs: clear to auscultation bilaterally, normal respiratory effort Abdomen: soft, gravid, non-tender;  EFW: 8lbs Pelvic:   External: Normal external female genitalia  Cervix: Dilation: 2 /   /   cx thick, soft, OOP   Extremities: non-tender, symmetric, No edema bilaterally.  DTRs: 2+  Neurologic: Alert & oriented x 3.    Results for orders  placed or performed during the hospital encounter of 01/18/22 (from the past 24 hour(s))  CBC     Status: Abnormal   Collection Time: 01/18/22  9:55 AM  Result Value Ref Range   WBC 10.4 4.0 - 10.5 K/uL   RBC 4.02 3.87 - 5.11 MIL/uL   Hemoglobin 11.8 (L) 12.0 - 15.0 g/dL   HCT 75.1 (L) 02.5 - 85.2 %   MCV 88.6 80.0 - 100.0 fL   MCH 29.4 26.0 - 34.0 pg   MCHC 33.1 30.0 - 36.0 g/dL   RDW 77.8 24.2 - 35.3 %   Platelets 206 150 - 400 K/uL   nRBC 0.0 0.0 - 0.2 %  Type and screen [facility]     Status: None   Collection Time:  01/18/22  9:55 AM  Result Value Ref Range   ABO/RH(D) A NEG    Antibody Screen NEG    Sample Expiration      01/21/2022,2359 Performed at [facility], [address] [place], [place] [address]   Comprehensive metabolic panel     Status: Abnormal   Collection Time: 01/18/22  9:55 AM  Result Value Ref Range   Sodium 137 135 - 145 mmol/L   Potassium 3.5 3.5 - 5.1 mmol/L   Chloride 108 98 - 111 mmol/L   CO2 23 22 - 32 mmol/L   Glucose, Bld 85 70 - 99 mg/dL   BUN 8 6 - 20 mg/dL   Creatinine, Ser 1.54 0.44 - 1.00 mg/dL   Calcium 8.6 (L) 8.9 - 10.3 mg/dL   Total Protein 6.0 (L) 6.5 - 8.1 g/dL   Albumin 2.8 (L) 3.5 - 5.0 g/dL   AST 19 15 - 41 U/L   ALT 15 0 - 44 U/L   Alkaline Phosphatase 108 38 - 126 U/L   Total Bilirubin 0.5 0.3 - 1.2 mg/dL   GFR, Estimated >00 >86 mL/min   Anion gap 6 5 - 15    Pertinent Results:  Prenatal Labs: Blood type/Rh A Neg  Antibody screen neg  Rubella Immune  Varicella NON-Immune  RPR NR  HBsAg Neg  HIV NR  GC neg  Chlamydia neg  Genetic screening negative  1 hour GTT  134  3 hour GTT   GBS  Neg   FHT: 145bpm, mod variability, + accels, no decels TOCO: rare UC noted   Cephalic by leopolds Bishop score: 3   No results found.  Assessment:  Brenda Fowler is a 36 y.o. G7P1001 female at [redacted]w[redacted]d with Cholestasis of pregnancy.   Plan:  1. Admit to Labor & Delivery; consents reviewed and obtained - discussed POC with pt and spouse at length. Reviewed VBAC success calculator 32-46%; currently with baby out of pelvis and unfavorable bishop score 3.  - after discussion, pt has decided to proceed with repeat CS.   2. Fetal Well being  - Fetal Tracing: Cat I - Group B Streptococcus ppx indicated: Neg - Presentation: cephalic  confirmed by [place]   3. Routine OB: - Prenatal labs reviewed, as above - Rh A neg - CBC, T&S, RPR on admit - Clear fluids, IVF  4. Post Partum Planning: - Infant feeding: breast - Contraception:  TBD  Randa Ngo, CNM 01/18/22 12:33 PM

## 2022-01-18 NOTE — Op Note (Addendum)
  Cesarean Section Procedure Note  Date of procedure: 01/18/2022   Pre-operative Diagnosis: Intrauterine pregnancy at [redacted]w[redacted]d;  - cholestasis of pregnancy - repeat cesarean section - failed induction of labor  Post-operative Diagnosis: same, delivered.  Procedure: Repeat Low Transverse Cesarean Section through Pfannenstiel incision  Surgeon: Christeen Douglas, MD  Assistant(s):  Heloise Ochoa, CNM  Anesthesia: Spinal anesthesia  Anesthesiologist: Lenard Simmer, MD Anesthesiologist: Lenard Simmer, MD CRNA: Mathews Argyle, CRNA  Estimated Blood Loss:   see anesthesia report, not yet calculated         Drains: foley         Total IV Fluids:  Urine Output:         Specimens: cord blood         Complications:  None; patient tolerated the procedure well.         Disposition: PACU - hemodynamically stable.         Condition: stable  Findings:  A female infant in cephalic presentation. Amniotic fluid - Clear  Birth weight 3120 g.  Apgars - please see neonatal report.  Intact placenta with a three-vessel cord.  Grossly normal uterus, tubes and ovaries bilaterally. Significant intraabdominal adhesions were noted but not to the uterus. Omentum attached widely and significantly to the fascia.  Procedure Details  The patient was taken to Operating Room, identified as the correct patient and the procedure verified as C-Section Delivery. A formal Time Out was held with all team members present and in agreement.  After induction of anesthesia, the patient was draped and prepped in the usual sterile manner. A Pfannenstiel skin incision was made and carried down through the subcutaneous tissue to the fascia. Fascial incision was made and extended transversely with the Mayo scissors. The fascia was separated from the underlying rectus tissue superiorly and inferiorly. The omentum is attached to the fascia and care taken to find the peritoneum. The omentum was doubly clamped and  tied to ensure safety and hemostasis in multiple steps.  The peritoneum was identified and entered bluntly. Peritoneal incision was extended longitudinally. The utero-vesical peritoneal reflection was incised transversely and a bladder flap was created digitally.   A low transverse hysterotomy was made. The fetus was delivered atraumatically. The umbilical cord was clamped x2 and cut and the infant was handed to the awaiting pediatricians. The placenta was removed intact and appeared normal, intact, and with a 3-vessel cord.   The uterus was exteriorized and cleared of all clot and debris. The hysterotomy was closed with running sutures of 0-Vicryl. A second imbricating layer was placed with the same suture. Excellent hemostasis was observed. The peritoneal cavity was cleared of all clots and debris. The uterus was returned to the abdomen.   The pelvis was irrigated and again, excellent hemostasis was noted. The fascia was then reapproximated with running sutures of 0 Vicryl. The subcutaneous tissue was reapproximated with running sutures of 0 Vicryl in two layers. The skin was reapproximated with a 4-0 Monocryl subcuticular stitch. 75ml (in 30 of 0.5% bupivicaine and 44ml of NSS) of bupivicaine placed in the fascial and skin lines.  Instrument, sponge, and needle counts were correct prior to the abdominal closure and at the conclusion of the case.   The patient tolerated the procedure well and was transferred to the recovery room in stable condition.   Christeen Douglas, MD 01/18/2022

## 2022-01-18 NOTE — Progress Notes (Addendum)
36yo G2P1001 at 37+5wks with cholestasis of pregnancy, planned iol turned c/s at patient preference with an unfavorable cervix and TOLAC   The risks of cesarean section discussed with the patient included but were not limited to: bleeding which may require transfusion or reoperation; infection which may require antibiotics; injury to bowel, bladder, ureters or other surrounding organs; injury to the fetus; need for additional procedures including hysterectomy in the event of a life-threatening hemorrhage; placental abnormalities wth subsequent pregnancies, incisional problems, thromboembolic phenomenon and other postoperative/anesthesia complications. The patient concurred with the proposed plan, giving informed written consent for the procedure. Anesthesia and OR aware. Preoperative prophylactic antibiotics and SCDs ordered on call to the OR.  To OR when ready.

## 2022-01-18 NOTE — Discharge Summary (Signed)
Obstetrical Discharge Summary  Patient Name: Brenda Fowler DOB: 1986-04-04 MRN: [id number]  Date of Admission: 01/18/2022 Date of Delivery: 01/18/22 Delivered by: Dalbert Garnet MD Date of Discharge: 01/18/2022  Primary OB: [facility] LMP:No LMP recorded. EDC Estimated Date of Delivery: 02/03/22 Gestational Age at Delivery: [redacted]w[redacted]d   Antepartum complications:  1. Cholestasis dx at 30wks 2. Prior CS- failed IOL 3. Obesity, BMI 41.8 4. RH Neg 5. Hx GHTN 6. Varicella NON-immune  Admitting Diagnosis: Cholestasis, 37wks, prior CS Secondary Diagnosis: rpeat LTCS  Patient Active Problem List   Diagnosis Date Noted   Cholestasis during pregnancy in third trimester 01/18/2022   Indication for care in labor or delivery 12/07/2021   Asthma without status asthmaticus 05/18/2019   History of UTI 05/18/2019   Strep throat 05/18/2019   Uterine size date discrepancy pregnancy, third trimester 05/18/2019   Intrahepatic cholestasis of pregnancy in third trimester, antepartum 11/10/2015   GBS (group B Streptococcus carrier), +RV culture, currently pregnant 11/07/2015   Pregnancy-induced hypertension in third trimester 10/30/2015   Supervision of normal first pregnancy 05/13/2015   Rh negative state in antepartum period, third trimester 04/12/2015   Obesity in pregnancy, antepartum, third trimester 02/15/2015    Augmentation: N/A Complications: None Intrapartum complications/course: initially admitted for IOL with unfavorable cervix and VBAC success calculator 32-46%, Pt opted for repeat cesarean, see Op note.  Date of Delivery: 01/18/22 Delivered By: Dalbert Garnet MD Delivery Type: repeat cesarean section, low transverse incision and cesarean indication: patient declines vag del attempt Anesthesia: spinal Placenta: manual Laceration: none Episiotomy: none Newborn Data: Live born female "Charli" Birth Weight: 6 lb 14.1 oz (3120 g) APGAR: 6, 8  Newborn Delivery   Birth date/time: 01/18/2022  13:36:00 Delivery type: C-Section, Low Transverse Trial of labor: No C-section categorization: Repeat     Postpartum Procedures: ***  Edinburgh:      No data to display            Post partum course: Cesarean Section:  Patient had an uncomplicated postpartum course.  By time of discharge on POD#***, her pain was controlled on oral pain medications; she had appropriate lochia and was ambulating, voiding without difficulty, tolerating regular diet and passing flatus.   She was deemed stable for discharge to home.    Discharge Physical Exam: *** BP 119/73 (BP Location: Right Arm)   Pulse 92   Temp 98 F (36.7 C) (Oral)   Resp 19   Ht 5\' 6"  (1.676 m)   Wt 117.5 kg   SpO2 99%   Breastfeeding Unknown   BMI 41.80 kg/m   General: NAD CV: RRR Pulm: CTABL, nl effort ABD: s/nd/nt, fundus firm and below the umbilicus Lochia: moderate ***Incision: c/d/i, healing well, no significant drainage, no dehiscence, no significant erythema DVT Evaluation: LE non-ttp, no evidence of DVT on exam.  Hemoglobin  Date Value Ref Range Status  01/18/2022 11.8 (L) 12.0 - 15.0 g/dL Final   HGB  Date Value Ref Range Status  09/30/2012 14.8 12.0 - 16.0 g/dL Final   HCT  Date Value Ref Range Status  01/18/2022 35.6 (L) 36.0 - 46.0 % Final  09/30/2012 44.2 35.0 - 47.0 % Final     Disposition: stable, discharge to home. Baby Feeding: breastmilk***formula Baby Disposition: home with mom***  Rh Immune globulin given: n/a, baby A Neg.  Rubella vaccine given: immune Varicella vaccine given: NON-immune, offer vaccine prior to DC Tdap vaccine given in AP or PP setting: 11/16/21 Flu vaccine given in AP or PP  setting: due in season  Contraception: ***spouse vasectomy  Prenatal Labs:  Blood type/Rh A Neg  Antibody screen neg  Rubella Immune  Varicella NON-Immune  RPR NR  HBsAg Neg  HIV NR  GC neg  Chlamydia neg  Genetic screening negative  1 hour GTT  134  3 hour GTT    GBS  Neg      Plan:  Meena C Merfeld was discharged to home in good condition. Follow-up appointment with delivering provider in 2 weeks.  Discharge Medications: Allergies as of 01/18/2022       Reactions   Amoxicillin Rash   Ceclor [cefaclor] Rash     Med Rec must be completed prior to using this SMARTLINK***        Follow-up Information     Christeen Douglas, MD Follow up in 2 week(s).   Specialty: Obstetrics and Gynecology Why: For postop check Contact information: [address] [place] [address] [phone]                 Signed:  Randa Ngo, CNM 01/18/2022  10:50 PM  *** Hit refresh and delete this line

## 2022-01-18 NOTE — Transfer of Care (Signed)
Immediate Anesthesia Transfer of Care Note  Patient: Brenda Fowler  Procedure(s) Performed: CESAREAN SECTION  Patient Location: Mother/Baby  Anesthesia Type:Spinal  Level of Consciousness: awake, alert  and oriented  Airway & Oxygen Therapy: Patient Spontanous Breathing  Post-op Assessment: Report given to RN and Post -op Vital signs reviewed and stable  Post vital signs: Reviewed  Last Vitals:  Vitals Value Taken Time  BP 122/70 01/18/22 1432  Temp 36.2 C 01/18/22 1432  Pulse 90 01/18/22 1432  Resp 16 01/18/22 1432  SpO2 99 % 01/18/22 1432    Last Pain:  Vitals:   01/18/22 0911  TempSrc:   PainSc: 0-No pain         Complications: No notable events documented.

## 2022-01-18 NOTE — Anesthesia Preprocedure Evaluation (Signed)
Anesthesia Evaluation  Patient identified by MRN, date of birth, ID band Patient awake    Reviewed: Allergy & Precautions, H&P , NPO status , Patient's Chart, lab work & pertinent test results, reviewed documented beta blocker date and time   History of Anesthesia Complications Negative for: history of anesthetic complications  Airway Mallampati: II  TM Distance: >3 FB Neck ROM: full    Dental no notable dental hx. (+) Dental Advidsory Given   Pulmonary neg shortness of breath, asthma , neg sleep apnea, neg COPD, neg recent URI, former smoker,    Pulmonary exam normal breath sounds clear to auscultation       Cardiovascular Exercise Tolerance: Good negative cardio ROS Normal cardiovascular exam Rhythm:regular Rate:Normal     Neuro/Psych negative neurological ROS  negative psych ROS   GI/Hepatic Neg liver ROS, GERD  ,  Endo/Other  neg diabetesMorbid obesity  Renal/GU negative Renal ROS  negative genitourinary   Musculoskeletal   Abdominal   Peds  Hematology negative hematology ROS (+)   Anesthesia Other Findings Past Medical History: No date: Anxiety 07/15/2012: Calculus of gallbladder with other cholecystitis, without  mention of obstruction No date: History of wisdom tooth extraction   Reproductive/Obstetrics (+) Pregnancy                             Anesthesia Physical Anesthesia Plan  ASA: 3  Anesthesia Plan: Spinal   Post-op Pain Management:    Induction:   PONV Risk Score and Plan:   Airway Management Planned: Natural Airway and Nasal Cannula  Additional Equipment:   Intra-op Plan:   Post-operative Plan:   Informed Consent: I have reviewed the patients History and Physical, chart, labs and discussed the procedure including the risks, benefits and alternatives for the proposed anesthesia with the patient or authorized representative who has indicated his/her  understanding and acceptance.     Dental Advisory Given  Plan Discussed with: Anesthesiologist, CRNA and Surgeon  Anesthesia Plan Comments:         Anesthesia Quick Evaluation

## 2022-01-19 LAB — CBC
HCT: 29.4 % — ABNORMAL LOW (ref 36.0–46.0)
Hemoglobin: 9.4 g/dL — ABNORMAL LOW (ref 12.0–15.0)
MCH: 28.8 pg (ref 26.0–34.0)
MCHC: 32 g/dL (ref 30.0–36.0)
MCV: 90.2 fL (ref 80.0–100.0)
Platelets: 201 10*3/uL (ref 150–400)
RBC: 3.26 MIL/uL — ABNORMAL LOW (ref 3.87–5.11)
RDW: 15 % (ref 11.5–15.5)
WBC: 13.4 10*3/uL — ABNORMAL HIGH (ref 4.0–10.5)
nRBC: 0 % (ref 0.0–0.2)

## 2022-01-19 LAB — RPR: RPR Ser Ql: NONREACTIVE

## 2022-01-19 MED ORDER — ESCITALOPRAM OXALATE 10 MG PO TABS: 20.0000 mg | ORAL_TABLET | Freq: Every day | ORAL | Status: DC

## 2022-01-19 MED ORDER — ESCITALOPRAM OXALATE 10 MG PO TABS
20.0000 mg | ORAL_TABLET | Freq: Every day | ORAL | Status: DC
  Administered 2022-01-20 – 2022-01-21 (×2): 20 mg via ORAL
  Filled 2022-01-19 (×2): qty 2

## 2022-01-19 NOTE — Lactation Note (Addendum)
This note was copied from a baby's chart. Lactation Consultation Note  Patient Name: Brenda Fowler Today's Date: 01/19/2022 Reason for consult: Initial assessment;Mother's request;Early term 37-38.6wks;Breastfeeding assistance (Baby was in SCN for transitioning period post delivery. Mom with acute blood loss anemia(1685 ml's).) Age:34 hours  Maternal Data This is mom's 2nd baby, born by repeat C/S. Per chart review baby transitioned in SCN due to continuous grunting and once resolved baby has been in room with mom. Mom with history of anxiety and depression (takes lexapro). Mom currently with acute blood loss anemia (1685 ml blood loss) Has patient been taught Hand Expression?: Yes Does the patient have breastfeeding experience prior to this delivery?: Yes  Feeding Mother's Current Feeding Choice: Breast Milk and Donor Milk Per mom's report baby has latched and breastfed a few times. At the last attempt baby latched but did not suckle. Mom post pumped and was concerned she only had a small amount of expressed colostrum. Baby bottle fed with slow flow nipple taking 15 ml's of donor milk per parents.   Lactation Tools Discussed/Used Tools: Pump Breast pump type: Double-Electric Breast Pump Pump Education: Setup, frequency, and cleaning;Milk Storage Reason for Pumping: inconsistent breastfeeding, baby early term Pumping frequency:  (If baby does not latch and breastfeed and/or mom desires to post pump after baby breastfeeds to foster establishment of her milk supply.) Pumped volume:  (Per mom drops from the right breast, none from the left breast)  Interventions Interventions: Breast feeding basics reviewed;Education (Discussed characteristics of potential inconsistent breastfeeding related to early term baby.) Discussed early term feeding plan if baby does not consistently latch and breastfeed. Addendum : Mom verbalized understanding that is baby does not latch and feed , or only partially  breastfeeds she will provide baby any expressed colostrum and/or donor milk. Mom understands to offer baby to breastfeed at least 8 times in 24 hours with feeding cues(or at least every 3 hours).  Discharge Discharge Education: Other (comment) (Mom reports she will bring baby to [facility] and understands she can see LC associated with the practice if she needs assistance once she goes home.) Pump: Personal (Mom reports she has a personal use DEBP at home.)  Consult Status Consult Status: Follow-up Date: 01/20/22 Follow-up type: In-patient  Update provided to care nurse.  Fuller Song 01/19/2022, 2:06 PM

## 2022-01-19 NOTE — Progress Notes (Signed)
Post Partum Day 1 Subjective: Doing well, no complaints.  Tolerating regular diet, pain with PO meds, and ambulating without difficulty. Has not had foley catheter removed yet, will remove today.  No CP SOB Fever,Chills, N/V or leg pain; denies nipple or breast pain, no HA change of vision, RUQ/epigastric pain.  Reports worsening anxiety and depression the last few weeks and would like her Lexapro dosage increased.  Objective: BP 109/79 (BP Location: Left Arm)   Pulse 68   Temp 98.4 F (36.9 C) (Oral)   Resp 18   Ht 5\' 6"  (1.676 m)   Wt 117.5 kg   SpO2 99%   Breastfeeding Unknown   BMI 41.80 kg/m    Physical Exam:  General: NAD Breasts: soft/nontender CV: RRR Pulm: nl effort, CTABL Abdomen: soft, NT, BS x 4 Incision: Dsg CDI/Honeycomb dressing in place/no erythema or drainage Lochia: moderate Uterine Fundus: fundus firm and 1 fb below umbilicus DVT Evaluation: no cords, ttp LEs   Recent Labs    01/18/22 0955 01/19/22 0642  HGB 11.8* 9.4*  HCT 35.6* 29.4*  WBC 10.4 13.4*  PLT 206 201    Assessment/Plan: 36 y.o. G2P2002 postpartum day # 1  - Continue routine PP care - Lactation consult  - Discussed contraceptive options including implant, IUDs hormonal and non-hormonal, injection, pills/ring/patch, condoms, and NFP.  - Acute blood loss anemia - hemodynamically stable and asymptomatic; continue po ferrous sulfate BID with stool softeners  - Immunization status: Needs varicella prior to DC - Lexapro dosage changed to 20mg  daily.  Disposition: Does not desire Dc home today.   20, CNM 01/19/2022 9:28 AM

## 2022-01-20 MED ORDER — IBUPROFEN 600 MG PO TABS
600.0000 mg | ORAL_TABLET | Freq: Four times a day (QID) | ORAL | Status: DC
  Administered 2022-01-20 – 2022-01-21 (×3): 600 mg via ORAL
  Filled 2022-01-20 (×3): qty 1

## 2022-01-20 NOTE — Anesthesia Postprocedure Evaluation (Signed)
Anesthesia Post Note  Patient: Brenda Fowler  Procedure(s) Performed: CESAREAN SECTION  Patient location during evaluation: Mother Baby Anesthesia Type: Spinal Level of consciousness: oriented and awake and alert Pain management: pain level controlled Vital Signs Assessment: post-procedure vital signs reviewed and stable Respiratory status: spontaneous breathing and respiratory function stable Cardiovascular status: blood pressure returned to baseline and stable Postop Assessment: no headache, no backache, no apparent nausea or vomiting and able to ambulate Anesthetic complications: no   No notable events documented.   Last Vitals:  Vitals:   01/20/22 0759 01/20/22 1630  BP: 124/76 123/77  Pulse: 85 75  Resp:  18  Temp: 36.7 C 36.9 C  SpO2: 100% 99%    Last Pain:  Vitals:   01/20/22 1840  TempSrc:   PainSc: 1                  Lenard Simmer

## 2022-01-20 NOTE — Progress Notes (Signed)
Post Partum Day 2 Subjective: Doing well, no complaints.  Tolerating regular diet, voiding and ambulating with mild difficulty from pain.  No CP SOB Fever,Chills, N/V or leg pain; denies nipple or breast pain, no HA change of vision, RUQ/epigastric pain  Objective: BP 124/76 (BP Location: Right Arm)   Pulse 85   Temp 98 F (36.7 C) (Oral)   Resp 20   Ht 5\' 6"  (1.676 m)   Wt 117.5 kg   SpO2 100%   Breastfeeding Unknown   BMI 41.80 kg/m    Physical Exam:  General: NAD Breasts: soft/nontender CV: RRR Pulm: nl effort, CTABL Abdomen: soft, NT, BS x 4 Incision: Dsg CDI/no erythema or drainage Lochia: moderate Uterine Fundus: fundus firm and 1 fb below umbilicus DVT Evaluation: no cords, ttp LEs   Recent Labs    01/18/22 0955 01/19/22 0642  HGB 11.8* 9.4*  HCT 35.6* 29.4*  WBC 10.4 13.4*  PLT 206 201    Assessment/Plan: 36 y.o. G2P2002 postpartum day # 2  - Continue routine PP care - Lactation consult - Discussed contraceptive options including implant, IUDs hormonal and non-hormonal, injection, pills/ring/patch, condoms, and NFP.  - Acute blood loss anemia - hemodynamically stable and asymptomatic; continue po ferrous sulfate BID with stool softeners  - Incision pain: continue inpatient pain medications, take 10mg  oxycodone instead of 5mg  oxycodone for relief. No evidence of infection or complications.   Disposition: Does not desire Dc home today.    20, CNM 01/20/2022 8:53 AM

## 2022-01-20 NOTE — Lactation Note (Signed)
This note was copied from a baby's chart. Lactation Consultation Note  Patient Name: Brenda Fowler [id number] Date: 01/20/2022 Reason for consult: Follow-up assessment;Early term 37-38.6wks;Mother's request Age:34 hours Lactation to the room for follow up visit. Mother is holding the baby on the right in football. Baby was latched but asleep. Mother stated she had attempted to latch but baby was sleepy, she fed some of the supplement to wake her and then attempted to latch again.  Encouraged feeding on demand and with cues. If baby is not cueing encouraged hand expression and skin to skin. Taught proper technique for hand expression. Mother is able to hand express drops. Discussed web site "first droplets.com" for HE technique and video.  Encouraged 8 or more good feeds after 24 HOL. Reviewed appropriate diapers for days of life and How to know your baby is getting enough to eat. Discussed age appropriate volumes for days of life. Reviewed manual pump, DEBP set up and initiate program. LC # left on board, encouraged to call for any assistance. Mother has no further questions at this time.   Maternal Data Has patient been taught Hand Expression?: Yes Does the patient have breastfeeding experience prior to this delivery?: Yes How long did the patient breastfeed?: 1 week  Feeding Mother's Current Feeding Choice: Breast Milk and Donor Milk Nipple Type: Slow - flow  LATCH Score Latch: Too sleepy or reluctant, no latch achieved, no sucking elicited.  Audible Swallowing: None  Type of Nipple: Everted at rest and after stimulation  Comfort (Breast/Nipple): Soft / non-tender  Hold (Positioning): Assistance needed to correctly position infant at breast and maintain latch.  LATCH Score: 5   Lactation Tools Discussed/Used Tools: Pump Breast pump type: Double-Electric Breast Pump;Manual Pump Education: Setup, frequency, and cleaning;Milk Storage Reason for Pumping: supplementing with DBM, MOB had  PPH, baby had supplemented in SCN Pumping frequency: encourgaed to pump 8x's/24 hours or with feeds Pumped volume: 0.1 mL  Interventions Interventions: Breast feeding basics reviewed;Assisted with latch;Hand express;DEBP;Hand pump;Education;Pace feeding  Discharge Pump: DEBP;Manual;Personal (has a Spectra at home)  Consult Status Consult Status: Follow-up Date: 01/21/22 Follow-up type: Call as needed    Kainen Struckman D Horald Birky 01/20/2022, 2:06 PM

## 2022-01-20 NOTE — Anesthesia Post-op Follow-up Note (Signed)
  Anesthesia Pain Follow-up Note  Patient: Brenda Fowler  Day #: 2  Date of Follow-up: 01/20/2022 Time: 7:46 PM  Last Vitals:  Vitals:   01/20/22 0759 01/20/22 1630  BP: 124/76 123/77  Pulse: 85 75  Resp:  18  Temp: 36.7 C 36.9 C  SpO2: 100% 99%    Level of Consciousness: alert  Pain: none   Side Effects:None  Catheter Site Exam:clean, dry     Plan: D/C from anesthesia care at surgeon's request  Lenard Simmer

## 2022-01-21 ENCOUNTER — Encounter: Payer: Self-pay | Admitting: Obstetrics and Gynecology

## 2022-01-21 DIAGNOSIS — Z98891 History of uterine scar from previous surgery: Principal | ICD-10-CM

## 2022-01-21 MED ORDER — OXYCODONE HCL 5 MG PO TABS: 5.0000 mg | ORAL_TABLET | ORAL | 0 refills | Status: AC | PRN

## 2022-01-21 MED ORDER — ESCITALOPRAM OXALATE 20 MG PO TABS: 20.0000 mg | ORAL_TABLET | Freq: Every day | ORAL | 3 refills | Status: AC

## 2022-01-21 MED ORDER — VARICELLA VIRUS VACCINE LIVE 1350 PFU/0.5ML IJ SUSR
0.5000 mL | INTRAMUSCULAR | Status: AC | PRN
  Administered 2022-01-21: 0.5 mL via SUBCUTANEOUS
  Filled 2022-01-21 (×2): qty 0.5

## 2022-01-21 MED ORDER — FERROUS SULFATE 325 (65 FE) MG PO TABS: 325.0000 mg | ORAL_TABLET | Freq: Every day | ORAL | Status: AC

## 2022-01-21 NOTE — Progress Notes (Signed)
Mother discharged.  Discharge instructions given.  Mother verbalizes understanding.  Transported by auxiliary.  

## 2022-01-21 NOTE — Lactation Note (Signed)
This note was copied from a baby's chart. Lactation Consultation Note  Patient Name: Brenda Fowler Date: 01/21/2022 Reason for consult: Follow-up assessment;Mother's request;Early term 37-38.6wks;Other (Comment) (c-section) Age:34 hours  Maternal Data Has patient been taught Hand Expression?: Yes Does the patient have breastfeeding experience prior to this delivery?: Yes How long did the patient breastfeed?: 1 week  Mom's second baby. Initially was putting baby to breast but found overnight that bottle+pumping was easier for her and her husband.  Feeding Mother's Current Feeding Choice: Breast Milk  Baby has been going to the breast, receiving donor breast milk via bottle.  LATCH Score Latch:  (Mom reports no longer putting baby to breast)   Lactation Tools Discussed/Used Tools: Pump;Coconut oil;Bottle Breast pump type: Double-Electric Breast Pump Reason for Pumping: 37wk; sleepy/poor transfer Pumping frequency: q 3 hrs  Provided mom with size 85mm breast shield to see if this is a better fit/feel while pumping  Interventions Interventions: Breast feeding basics reviewed;Breast massage;Hand express;Coconut oil;DEBP;Hand pump;Education;Ice;Pace feeding (Optimal milk output, 37wk infant behaviors, early cues, mimick cluster feeding w/ pump, milk storage/thawing)  Encouraged continued attempts at the breast, difference between breast nipple/bottle nipple and milk volume- how this can improve as milk increases in volume. Education given for feeding behaviors of 37wk infant and how breast feeding can improve with age closer to 40 weeks. Reviewed paced bottle feeding to help minimize over feeding, stretching baby's stomach, and appropriate volumes/age.  Tips given for comfort while pumping, importance of comfort to prevent cortisol and inhibit MER.  Discharge Discharge Education: Engorgement and breast care;Warning signs for feeding baby;Outpatient recommendation Pump:  Manual;Personal (Spectra)  Breast fullness/engorgement care given, encouraged frequent emptying, hands on pumping, pre/post hand expression, Ice to be used for swelling and tylenol/motrin.  Consult Status Consult Status: Complete  Outpatient lactation service information given. Encouraged to call with ongoing questions and support.  Brenda Fowler 01/21/2022, 10:08 AM

## 2022-01-21 NOTE — Discharge Instructions (Signed)

## 2022-01-23 ENCOUNTER — Encounter: Payer: Self-pay | Admitting: Obstetrics

## 2022-01-23 NOTE — Discharge Summary (Signed)
Brenda Fowler is a 36 y.o. female. She is at [redacted]w[redacted]d gestation. No LMP recorded. Patient is pregnant. Estimated Date of Delivery: 02/03/22    Prenatal care site: [facility]   Chief Complaint: high risk pregnancy, need for antepartum surveillance   S: Resting comfortably. no CTX, no VB.no LOF,  Active fetal movement.    Maternal Medical History:  Past Medical Hx:  has a past medical history of Anxiety, Calculus of gallbladder with other cholecystitis, without mention of obstruction (07/15/2012), and History of wisdom tooth extraction.  Past Surgical Hx:  has a past surgical history that includes Cholecystectomy (09/30/2012); Cesarean section (N/A, 11/13/2015); and Cesarean section (01/18/2022).   Allergies  Allergen Reactions   Amoxicillin Rash   Ceclor [Cefaclor] Rash    Prior to Admission medications   Medication Sig Start Date End Date Taking? Authorizing Provider  escitalopram (LEXAPRO) 10 MG tablet Take 10 mg by mouth daily.   Yes [provider]  famotidine (PEPCID) 10 MG tablet Take 10 mg by mouth 2 (two) times daily.   Yes [provider]  ferrous sulfate 325 (65 FE) MG tablet Take 325 mg by mouth 2 (two) times daily. Patient not taking: Reported on 01/18/2022   Yes [provider]  loratadine (CLARITIN) 10 MG tablet Take 10 mg by mouth daily.   Yes [provider]  Prenatal Vit-Fe Fumarate-FA (MULTIVITAMIN-PRENATAL) 27-0.8 MG TABS tablet Take 1 tablet by mouth daily at 12 noon.   Yes [provider]  escitalopram (LEXAPRO) 20 MG tablet Take 1 tablet (20 mg total) by mouth daily. 01/21/22 01/21/23  Haroldine Laws, CNM  ferrous sulfate 325 (65 FE) MG tablet Take 1 tablet (325 mg total) by mouth daily with breakfast. 01/21/22   Haroldine Laws, CNM  oxyCODONE (OXY IR/ROXICODONE) 5 MG immediate release tablet Take 1-2 tablets (5-10 mg total) by mouth every 4 (four) hours as needed for moderate pain. 01/21/22   Haroldine Laws, CNM   pantoprazole (PROTONIX) 20 MG tablet Take 20 mg by mouth daily.    [provider]     Social History: She  reports that she quit smoking about 11 years ago. Her smoking use included cigarettes. She has never used smokeless tobacco. She reports that she does not drink alcohol and does not use drugs.  Family History: family history includes Cancer in her mother.  no history of gyn cancers  Review of Systems: A full review of systems was performed and negative except as noted in the HPI.     O:  BP 124/77 (BP Location: Left Arm)   Pulse 90   Temp 98.3 F (36.8 C) (Oral)   Resp 18   Ht 5\' 6"  (1.676 m)   Wt 114.3 kg   BMI 40.67 kg/m  No results found for this or any previous visit (from the past 48 hour(s)).   Constitutional: NAD, AAOx3  HE/ENT: extraocular movements grossly intact, moist mucous membranes CV: RRR PULM: nl respiratory effort, CTABL     Abd: gravid, non-tender, non-distended, soft      Ext: Non-tender, Nonedmeatous   Psych: mood appropriate, speech normal Pelvic: deferred   NST: Baseline: 145 Variability: moderate Accelerations present x >2 Decelerations absent Time   A/P: 36 y.o. [redacted]w[redacted]d with high risk pregnancy and antepartum surveillance.  Labor: not present.  Fetal Wellbeing: Reassuring Cat 1 tracing. NST reviewed, reactive NST  D/c home stable, precautions reviewed, follow-up as scheduled.   ----- [redacted]w[redacted]d CNM Certified Nurse Midwife Chari Manning  [facility]

## 2023-12-05 ENCOUNTER — Other Ambulatory Visit: Payer: Self-pay

## 2023-12-05 DIAGNOSIS — Z803 Family history of malignant neoplasm of breast: Secondary | ICD-10-CM

## 2024-04-28 ENCOUNTER — Ambulatory Visit
Admission: EM | Admit: 2024-04-28 | Discharge: 2024-04-28 | Disposition: A | Discharge status: Home / Self Care | Attending: Emergency Medicine | Admitting: Emergency Medicine

## 2024-04-28 ENCOUNTER — Encounter: Payer: Self-pay | Admitting: Emergency Medicine

## 2024-04-28 DIAGNOSIS — R42 Dizziness and giddiness: Secondary | ICD-10-CM | POA: Diagnosis not present

## 2024-04-28 DIAGNOSIS — J019 Acute sinusitis, unspecified: Secondary | ICD-10-CM

## 2024-04-28 MED ORDER — KETOROLAC TROMETHAMINE 30 MG/ML IJ SOLN
30.0000 mg | Freq: Once | INTRAMUSCULAR | Status: AC
Start: 2024-04-28 — End: 2024-04-28
  Administered 2024-04-28: 30 mg via INTRAMUSCULAR

## 2024-04-28 MED ORDER — AZITHROMYCIN 250 MG PO TABS
250.0000 mg | ORAL_TABLET | Freq: Every day | ORAL | 0 refills | Status: AC
Start: 2024-04-28 — End: ?

## 2024-04-28 NOTE — Discharge Instructions (Signed)
 Today you are evaluated for your symptoms with the most consistent with a sinus infection  Take azithromycin as directed to clear bacteria contributing to symptoms  You have been given an injection of Toradol  which helps reduce inflammation throughout the body to ideally help reduce sinus pressure quickly which will help to minimize symptoms should start to feel some relief within the next 30 minutes to an hour    You can take Tylenol  and/or Ibuprofen  as needed for fever reduction and pain relief.   For cough: honey 1/2 to 1 teaspoon (you can dilute the honey in water or another fluid).  You can also use guaifenesin and dextromethorphan for cough. You can use a humidifier for chest congestion and cough.  If you don't have a humidifier, you can sit in the bathroom with the hot shower running.      For sore throat: try warm salt water gargles, cepacol lozenges, throat spray, warm tea or water with lemon/honey, popsicles or ice, or OTC cold relief medicine for throat discomfort.   For congestion: take a daily anti-histamine like Zyrtec, Claritin , and a oral decongestant, such as pseudoephedrine.  You can also use Flonase 1-2 sprays in each nostril daily.   It is important to stay hydrated: drink plenty of fluids (water, gatorade/powerade/pedialyte, juices, or teas) to keep your throat moisturized and help further relieve irritation/discomfort.

## 2024-04-28 NOTE — ED Provider Notes (Signed)
 [facility]    CSN: [id number] Arrival date & time: 04/28/24  1400      History   Chief Complaint Chief Complaint  Patient presents with   Headache   Dizziness   Nausea   Muscle Pain    Posterior neck pain     HPI Pama C Gillie is a 38 y.o. female.   Patient presents for evaluation of headache beginning 2 days ago.  Described as posterior with soreness at the base of the neck, described as a squeezing sensation and at its severe it is making it difficult to to to focus.  It feels as if there is pressure behind the eyes and has had twitching of the upper lid of the right eye which accompanies and is exacerbated by headache.  Began to experience dizziness today described as feeling drunk or off balance worsened by change of position to standing.  Has felt nauseous but denies vomiting.  Has had intermittent tenderness to the cheeks.  Symptoms improved with use of ibuprofen  800 mg.  Denies fever, congestion or cough, blurred vision, light sensitivity.  Known sick contacts in household approximately 2 weeks ago.  Tolerable to food and liquids but appetite decreased.  History of sinusitis.  History of seasonal allergies, not currently taking medicine.   Past Medical History:  Diagnosis Date   Anxiety    Calculus of gallbladder with other cholecystitis, without mention of obstruction 07/15/2012   History of wisdom tooth extraction     Patient Active Problem List   Diagnosis Date Noted   Status post repeat low transverse cesarean section 01/21/2022   Supervision of high risk pregnancy in third trimester 07/18/2021   Asthma without status asthmaticus 05/18/2019   History of UTI 05/18/2019   Strep throat 05/18/2019   Pregnancy-induced hypertension in third trimester 10/30/2015   Supervision of normal first pregnancy 05/13/2015   Rh negative state in antepartum period, third trimester 04/12/2015    Past Surgical History:  Procedure Laterality Date   CESAREAN SECTION N/A  11/13/2015   Procedure: CESAREAN SECTION;  Surgeon: Heather Penton, MD;  Location: [facility];  Service: Obstetrics;  Laterality: N/A;   CESAREAN SECTION  01/18/2022   Procedure: CESAREAN SECTION;  Surgeon: Penton Heather, MD;  Location: [facility];  Service: Obstetrics;;   CHOLECYSTECTOMY  09/30/2012    OB History     Gravida  2   Para  2   Term  2   Preterm      AB      Living  2      SAB      IAB      Ectopic      Multiple  0   Live Births  2        Obstetric Comments  LMP-February 2014 Age with first menstruation-16          Home Medications    Prior to Admission medications   Medication Sig Start Date End Date Taking? Authorizing Provider  azithromycin (ZITHROMAX) 250 MG tablet Take 1 tablet (250 mg total) by mouth daily. Take first 2 tablets together, then 1 every day until finished. 04/28/24  Yes Wenceslao Loper R, NP  escitalopram  (LEXAPRO ) 10 MG tablet Take 10 mg by mouth daily.    [provider]  escitalopram  (LEXAPRO ) 20 MG tablet Take 1 tablet (20 mg total) by mouth daily. 01/21/22 01/21/23  Myron Nest, CNM  famotidine (PEPCID) 10 MG tablet Take 10 mg by mouth 2 (two) times daily.  [provider]  ferrous sulfate  325 (65 FE) MG tablet Take 325 mg by mouth 2 (two) times daily. Patient not taking: Reported on 01/18/2022    [provider]  ferrous sulfate  325 (65 FE) MG tablet Take 1 tablet (325 mg total) by mouth daily with breakfast. 01/21/22   Myron Nest, CNM  loratadine  (CLARITIN ) 10 MG tablet Take 10 mg by mouth daily.    [provider]  oxyCODONE  (OXY IR/ROXICODONE ) 5 MG immediate release tablet Take 1-2 tablets (5-10 mg total) by mouth every 4 (four) hours as needed for moderate pain. 01/21/22   Myron Nest, CNM  pantoprazole  (PROTONIX ) 20 MG tablet Take 20 mg by mouth daily.    [provider]  Prenatal Vit-Fe Fumarate-FA (MULTIVITAMIN-PRENATAL) 27-0.8 MG TABS tablet Take 1 tablet by mouth  daily at 12 noon.    [provider]    Family History Family History  Problem Relation Age of Onset   Cancer Mother     Social History Social History   Tobacco Use   Smoking status: Former    Current packs/day: 0.00    Types: Cigarettes    Quit date: 07/15/2010    Years since quitting: 13.7   Smokeless tobacco: Never  Vaping Use   Vaping status: Never Used  Substance Use Topics   Alcohol use: No   Drug use: No     Allergies   Amoxicillin and Ceclor [cefaclor]   Review of Systems Review of Systems   Physical Exam Triage Vital Signs ED Triage Vitals  Encounter Vitals Group     BP 04/28/24 1428 122/79     Girls Systolic BP Percentile --      Girls Diastolic BP Percentile --      Boys Systolic BP Percentile --      Boys Diastolic BP Percentile --      Pulse --      Resp 04/28/24 1428 18     Temp 04/28/24 1428 98.6 F (37 C)     Temp Source 04/28/24 1428 Oral     SpO2 04/28/24 1428 100 %     Weight --      Height --      Head Circumference --      Peak Flow --      Pain Score 04/28/24 1425 3     Pain Loc --      Pain Education --      Exclude from Growth Chart --    No data found.  Updated Vital Signs BP 122/79 (BP Location: Left Arm)   Temp 98.6 F (37 C) (Oral)   Resp 18   LMP 04/17/2024 (Exact Date)   SpO2 100%   Breastfeeding No   Visual Acuity Right Eye Distance:   Left Eye Distance:   Bilateral Distance:    Right Eye Near:   Left Eye Near:    Bilateral Near:     Physical Exam Constitutional:      Appearance: Normal appearance.  HENT:     Head: Normocephalic.     Right Ear: Tympanic membrane, ear canal and external ear normal.     Left Ear: Tympanic membrane, ear canal and external ear normal.     Nose: Nose normal.     Mouth/Throat:     Mouth: Mucous membranes are moist.     Pharynx: Oropharynx is clear. No oropharyngeal exudate or posterior oropharyngeal erythema.  Eyes:     Extraocular Movements: Extraocular  movements intact.  Pulmonary:  Effort: Pulmonary effort is normal.  Lymphadenopathy:     Cervical: Cervical adenopathy present.  Neurological:     General: No focal deficit present.     Mental Status: She is alert and oriented to person, place, and time. Mental status is at baseline.      UC Treatments / Results  Labs (all labs ordered are listed, but only abnormal results are displayed) Labs Reviewed - No data to display  EKG   Radiology No results found.  Procedures Procedures (including critical care time)  Medications Ordered in UC Medications  ketorolac  (TORADOL ) 30 MG/ML injection 30 mg (30 mg Intramuscular Given 04/28/24 1449)    Initial Impression / Assessment and Plan / UC Course  I have reviewed the triage vital signs and the nursing notes.  Pertinent labs & imaging results that were available during my care of the patient were reviewed by me and considered in my medical decision making (see chart for details).  Acute nonrecurrent sinusitis, dizziness  Symptom most consistent with above diagnoses, no neurological deficits present on exam, history of reoccurring sinusitis empirically placed on azithromycin, currently not experiencing headache however Toradol  IM given for management of the sinuses, deferring use of oral prednisone  as she endorses an intolerance, recommended over-the-counter medications and a supportive care with mucolytic's and decongestants and advised follow-up with primary doctor or urgent care for reevaluation next Final Clinical Impressions(s) / UC Diagnoses   Final diagnoses:  Dizziness  Acute non-recurrent sinusitis, unspecified location     Discharge Instructions      Today you are evaluated for your symptoms with the most consistent with a sinus infection  Take azithromycin as directed to clear bacteria contributing to symptoms  You have been given an injection of Toradol  which helps reduce inflammation throughout the body to  ideally help reduce sinus pressure quickly which will help to minimize symptoms should start to feel some relief within the next 30 minutes to an hour    You can take Tylenol  and/or Ibuprofen  as needed for fever reduction and pain relief.   For cough: honey 1/2 to 1 teaspoon (you can dilute the honey in water or another fluid).  You can also use guaifenesin and dextromethorphan for cough. You can use a humidifier for chest congestion and cough.  If you don't have a humidifier, you can sit in the bathroom with the hot shower running.      For sore throat: try warm salt water gargles, cepacol lozenges, throat spray, warm tea or water with lemon/honey, popsicles or ice, or OTC cold relief medicine for throat discomfort.   For congestion: take a daily anti-histamine like Zyrtec, Claritin , and a oral decongestant, such as pseudoephedrine.  You can also use Flonase 1-2 sprays in each nostril daily.   It is important to stay hydrated: drink plenty of fluids (water, gatorade/powerade/pedialyte, juices, or teas) to keep your throat moisturized and help further relieve irritation/discomfort.    ED Prescriptions     Medication Sig Dispense Auth. Provider   azithromycin (ZITHROMAX) 250 MG tablet Take 1 tablet (250 mg total) by mouth daily. Take first 2 tablets together, then 1 every day until finished. 6 tablet Geneve Kimpel R, NP      PDMP not reviewed this encounter.   Teresa Shelba SAUNDERS, NP 04/28/24 1549

## 2024-04-28 NOTE — ED Triage Notes (Signed)
 Patient reports headache x 2 days. Took Ibuprofen  800 mg and headache goes away for awhile. Patient states when the headache returns she started to feel lightheaded, nausea, neck pain and has dizziness. Last dose of 800 mg of Ibuprofen  was at 1:00 pm. Rates neck pain 3/10.

## 2024-06-24 ENCOUNTER — Ambulatory Visit: Admitting: Podiatry

## 2024-06-24 DIAGNOSIS — M7671 Peroneal tendinitis, right leg: Secondary | ICD-10-CM

## 2024-06-24 NOTE — Progress Notes (Unsigned)
 Subjective:  Patient ID: Brenda Fowler, female    DOB: 31-Dec-1985,  MRN: [id number]  Chief Complaint  Patient presents with   Foot Pain    Right foot pain     38 y.o. female presents with the above complaint.  Patient presents with new complaint of right lateral foot pain that has been on for quite some time is progressive gotten worse worse with ambulation and shoe pressure has not seen MRIs prior to seeing me denies any other acute complaints.  Pain scale 7 out of 10 dull aching nature hurts with ambulation   Review of Systems: Negative except as noted in the HPI. Denies N/V/F/Ch.  Past Medical History:  Diagnosis Date   Anxiety    Calculus of gallbladder with other cholecystitis, without mention of obstruction 07/15/2012   History of wisdom tooth extraction    Current Medications[1]  Tobacco Use History[2]  Allergies[3] Objective:  There were no vitals filed for this visit. There is no height or weight on file to calculate BMI. Constitutional Well developed. Well nourished.  Vascular Dorsalis pedis pulses palpable bilaterally. Posterior tibial pulses palpable bilaterally. Capillary refill normal to all digits.  No cyanosis or clubbing noted. Pedal hair growth normal.  Neurologic Normal speech. Oriented to person, place, and time. Epicritic sensation to light touch grossly present bilaterally.  Dermatologic Nails well groomed and normal in appearance. No open wounds. No skin lesions.  Orthopedic: Pain on palpation to the right peroneal tendon along the course of the tendon including insertion pain with dorsiflexion eversion of the foot resisted no pain with plantarflexion inversion of the foot.  No pain at the Achilles tendon ATFL ligament.  Posterior tibial tendon   Radiographs: None Assessment:   1. Peroneal tendinitis of lower leg, right    Plan:  Patient was evaluated and treated and all questions answered.  Right peroneal tendinitis - All questions and  concerns were discussed with the patient in extensive detail given the amount of pain that she is experiencing she will benefit from cam boot immobilization patient agrees upon to proceed with cam boot immobilization Cam boot was dispensed.  No follow-ups on file.     [1]  Current Outpatient Medications:    azithromycin  (ZITHROMAX ) 250 MG tablet, Take 1 tablet (250 mg total) by mouth daily. Take first 2 tablets together, then 1 every day until finished., Disp: 6 tablet, Rfl: 0   escitalopram  (LEXAPRO ) 10 MG tablet, Take 10 mg by mouth daily., Disp: , Rfl:    escitalopram  (LEXAPRO ) 20 MG tablet, Take 1 tablet (20 mg total) by mouth daily., Disp: 90 tablet, Rfl: 3   famotidine (PEPCID) 10 MG tablet, Take 10 mg by mouth 2 (two) times daily., Disp: , Rfl:    ferrous sulfate  325 (65 FE) MG tablet, Take 325 mg by mouth 2 (two) times daily. (Patient not taking: Reported on 01/18/2022), Disp: , Rfl:    ferrous sulfate  325 (65 FE) MG tablet, Take 1 tablet (325 mg total) by mouth daily with breakfast., Disp: , Rfl:    loratadine  (CLARITIN ) 10 MG tablet, Take 10 mg by mouth daily., Disp: , Rfl:    oxyCODONE  (OXY IR/ROXICODONE ) 5 MG immediate release tablet, Take 1-2 tablets (5-10 mg total) by mouth every 4 (four) hours as needed for moderate pain., Disp: 30 tablet, Rfl: 0   pantoprazole  (PROTONIX ) 20 MG tablet, Take 20 mg by mouth daily., Disp: , Rfl:    Prenatal Vit-Fe Fumarate-FA (MULTIVITAMIN-PRENATAL) 27-0.8 MG TABS tablet, Take 1  tablet by mouth daily at 12 noon., Disp: , Rfl:  [2]  Social History Tobacco Use  Smoking Status Former   Current packs/day: 0.00   Types: Cigarettes   Quit date: 07/15/2010   Years since quitting: 13.9  Smokeless Tobacco Never  [3]  Allergies Allergen Reactions   Amoxicillin Rash   Ceclor [Cefaclor] Rash

## 2024-07-22 ENCOUNTER — Ambulatory Visit: Admitting: Podiatry
